# Patient Record
Sex: Female | Born: 1955 | Race: White | Hispanic: No | Marital: Married | Smoking: Never smoker
Health system: Southern US, Community
[De-identification: ages and names within clinical notes are randomized; demographics above are authoritative.]

## PROBLEM LIST (undated history)

## (undated) DIAGNOSIS — G510 Bell's palsy: Secondary | ICD-10-CM

## (undated) DIAGNOSIS — Z87898 Personal history of other specified conditions: Secondary | ICD-10-CM

## (undated) DIAGNOSIS — Z9889 Other specified postprocedural states: Secondary | ICD-10-CM

## (undated) DIAGNOSIS — Z8679 Personal history of other diseases of the circulatory system: Secondary | ICD-10-CM

## (undated) DIAGNOSIS — N210 Calculus in bladder: Secondary | ICD-10-CM

## (undated) DIAGNOSIS — M1990 Unspecified osteoarthritis, unspecified site: Secondary | ICD-10-CM

## (undated) DIAGNOSIS — E039 Hypothyroidism, unspecified: Secondary | ICD-10-CM

## (undated) DIAGNOSIS — M810 Age-related osteoporosis without current pathological fracture: Secondary | ICD-10-CM

## (undated) DIAGNOSIS — I10 Essential (primary) hypertension: Secondary | ICD-10-CM

## (undated) DIAGNOSIS — Z86010 Personal history of colonic polyps: Secondary | ICD-10-CM

## (undated) HISTORY — PX: CARDIAC ELECTROPHYSIOLOGY STUDY AND ABLATION: SHX1294

## (undated) HISTORY — DX: Age-related osteoporosis without current pathological fracture: M81.0

## (undated) HISTORY — DX: Personal history of colonic polyps: Z86.010

## (undated) HISTORY — DX: Essential (primary) hypertension: I10

## (undated) HISTORY — DX: Bell's palsy: G51.0

---

## 1997-07-06 HISTORY — PX: LAPAROSCOPIC ASSISTED VAGINAL HYSTERECTOMY: SHX5398

## 1998-10-01 ENCOUNTER — Ambulatory Visit (HOSPITAL_COMMUNITY): Admission: RE | Admit: 1998-10-01 | Discharge: 1998-10-01 | Payer: Self-pay | Admitting: Internal Medicine

## 1998-10-09 ENCOUNTER — Observation Stay (HOSPITAL_COMMUNITY): Admission: AD | Admit: 1998-10-09 | Discharge: 1998-10-10 | Payer: Self-pay | Admitting: Internal Medicine

## 1999-01-31 ENCOUNTER — Encounter (INDEPENDENT_AMBULATORY_CARE_PROVIDER_SITE_OTHER): Payer: Self-pay

## 1999-01-31 ENCOUNTER — Other Ambulatory Visit: Admission: RE | Admit: 1999-01-31 | Discharge: 1999-01-31 | Payer: Self-pay | Admitting: Obstetrics and Gynecology

## 1999-03-25 ENCOUNTER — Inpatient Hospital Stay (HOSPITAL_COMMUNITY): Admission: RE | Admit: 1999-03-25 | Discharge: 1999-03-28 | Payer: Self-pay | Admitting: Obstetrics and Gynecology

## 1999-03-25 ENCOUNTER — Encounter (INDEPENDENT_AMBULATORY_CARE_PROVIDER_SITE_OTHER): Payer: Self-pay

## 1999-10-21 ENCOUNTER — Encounter: Admission: RE | Admit: 1999-10-21 | Discharge: 1999-10-21 | Payer: Self-pay | Admitting: Obstetrics and Gynecology

## 1999-10-21 ENCOUNTER — Encounter: Payer: Self-pay | Admitting: Obstetrics and Gynecology

## 2000-07-06 HISTORY — PX: CARDIAC CATHETERIZATION: SHX172

## 2001-12-08 ENCOUNTER — Encounter: Admission: RE | Admit: 2001-12-08 | Discharge: 2001-12-08 | Payer: Self-pay | Admitting: Obstetrics and Gynecology

## 2001-12-08 ENCOUNTER — Encounter: Payer: Self-pay | Admitting: Obstetrics and Gynecology

## 2003-02-12 ENCOUNTER — Encounter: Admission: RE | Admit: 2003-02-12 | Discharge: 2003-05-13 | Payer: Self-pay | Admitting: Family Medicine

## 2005-03-12 ENCOUNTER — Encounter: Admission: RE | Admit: 2005-03-12 | Discharge: 2005-03-12 | Payer: Self-pay | Admitting: Internal Medicine

## 2005-08-19 ENCOUNTER — Ambulatory Visit (HOSPITAL_COMMUNITY): Admission: RE | Admit: 2005-08-19 | Discharge: 2005-08-19 | Payer: Self-pay | Admitting: Obstetrics and Gynecology

## 2007-02-15 ENCOUNTER — Ambulatory Visit (HOSPITAL_COMMUNITY): Admission: RE | Admit: 2007-02-15 | Discharge: 2007-02-15 | Payer: Self-pay | Admitting: Obstetrics and Gynecology

## 2008-01-02 ENCOUNTER — Ambulatory Visit (HOSPITAL_COMMUNITY): Admission: RE | Admit: 2008-01-02 | Discharge: 2008-01-02 | Payer: Self-pay | Admitting: Gastroenterology

## 2008-01-17 ENCOUNTER — Ambulatory Visit: Payer: Self-pay | Admitting: Internal Medicine

## 2008-01-20 ENCOUNTER — Ambulatory Visit: Payer: Self-pay | Admitting: Internal Medicine

## 2008-01-20 ENCOUNTER — Ambulatory Visit: Payer: Self-pay

## 2008-04-10 ENCOUNTER — Ambulatory Visit (HOSPITAL_COMMUNITY): Admission: RE | Admit: 2008-04-10 | Discharge: 2008-04-10 | Payer: Self-pay | Admitting: Obstetrics and Gynecology

## 2010-02-03 LAB — HM PAP SMEAR: HM Pap smear: NEGATIVE

## 2010-02-27 ENCOUNTER — Ambulatory Visit (HOSPITAL_COMMUNITY): Admission: RE | Admit: 2010-02-27 | Discharge: 2010-02-27 | Payer: Self-pay | Admitting: Internal Medicine

## 2010-03-26 ENCOUNTER — Ambulatory Visit (HOSPITAL_COMMUNITY): Admission: RE | Admit: 2010-03-26 | Discharge: 2010-03-26 | Payer: Self-pay | Admitting: Obstetrics and Gynecology

## 2010-11-18 NOTE — Letter (Signed)
January 17, 2008    Barry Dienes. Eloise Harman, M.D.  [address]  [place], [place] [address]   RE:  Natalie, Schultz  MRN:  [id number]  /  DOB:  1955/08/02   Dear Reuel Boom:   It was a pleasure seeing Natalie Schultz today at your request  regarding her syncope.   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   What was interesting about the aforementioned spells was that they were  unassociated with the prodrome, unassociated with palpitations for the  most part, and found it somewhat neurally mediated.   These episodes were quite similar.  The first one occurred a month or so  ago.  She was seen on the couch and she lost consciousness.  There was  significant recovery fatigue and as noted above this was quite constant  with her previous episodes.  She has also had a number of interval  episodes of presyncope, all in the last number of months, all quite  similar to the syncopal episode unlike that unassociated with  palpitations.  We need to recall that palpitations were not associated  with her previous episodes 10 years ago either.  Of concern, one of  these episode occurred while working on the elliptical (see below).   She has noted some problems of late with exercise tolerance, manifested  by primarily more fatigue, but also some shortness of breath at work and  she also describes some left arm tingling primarily following exertion.   Her cardiac risk factors are negative for hypertension.   FAMILY HISTORY:  Cigarettes.  She also had a negative Myoview scan for  atypical chest symptoms about 8 years ago.   She could easily have occasional  palpitations that are not tachy  palpitations.  Following her ablation for similar complaints, we  undertook an event monitor, which demonstrated PVCs.   Her current medications include Synthroid 125 mcg and Prilosec, as well  as a variety of vitamins.   Her allergies include ERYTHROMYCIN, BIAXIN, AMOXICILLIN.   SOCIAL HISTORY:  She is married.  She has four children.  She and her  husband used to work for [employer].  She does not use cigarettes or  recreational drugs.  She does drink alcohol occasionally.   Her review of systems was notable for fatigue, hiatal hernia, GE reflux  disease, with some associated chest discomfort sometimes with recumbent,  but not necessarily, and thyroid disease.   PAST SURGICAL HISTORY:  Notable for partial hysterectomy and catheter  ablation.   On examination, she is a middle-aged Caucasian female appearing of her  stated age of 56.  Her blood pressure was 122/85 with a pulse of 61  without orthostatic change.  She did actually have some diastolic  hypertension with blood pressures recorded 91-100 with a maximum pulse  change from 61-83.  Her weight was 158, which is down about 13 pounds  from 7 years ago.  Her HEENT exam demonstrated no icterus, no xanthoma.  The neck veins were flat.  The carotids were brisk and full bilaterally  without bruits.  The back was without kyphosis or scoliosis.  The  lungs  were clear.  Heart sounds regular without murmurs or gallops.  The  abdomen was soft with active bowel sounds.  Femoral pulses were 2+,  distal pulses were intact.  There was no clubbing, cyanosis, or edema.  Neurological exam was grossly normal.  The skin was warm and dry.   Electrocardiogram dated today demonstrated sinus rhythm at 65 with  intervals of 0.15/0.19/0.40.  The axis was leftward to -15.   IMPRESSION:  1. Syncope.  In the past and again presumably thought to be neurally      mediated, question trigger.  2. Previously identified  atrioventricular nodal reentry unassociated      with palpitations, but provoking a syncope status post catheter      ablation without recurrences.  3. Premature ventricular contractions.  4. Chest pain syndrome characterized by largely nonexertional chest      tightness and accompanied contemporaneously with fatigue and left      arm tingling.   Natalie Schultz's symptoms are concerning especially given the fact that  last time she had no palpitations really to guide [place] towards finding the  tachycardia that seemed to have been the culprit.  I suspect that she  has a trigger again for her syncope.  The question is what is going to  turn out to be.  With her chest pain and left arm tingling, I think  excluding ischemia it is important as a Bezold-Jarisch reflex can  certainly induce a neurally-mediated reflex.  I think the likelihood of  this is relatively low.  An arrhythmic trigger is certainly the most  likely suspect given its presentation before and I have arranged for her  to get an event recorded to help [place] try to elucidate this and we will  plan to sit down in about 3-4 weeks' time to review that result with  her.   Thank you very much for the consultation.    Sincerely,      Duke Salvia, MD, [facility]  Electronically Signed    SCK/MedQ  DD: 01/17/2008  DT: 01/18/2008  Job #: [id number]

## 2011-04-07 ENCOUNTER — Other Ambulatory Visit: Payer: Self-pay | Admitting: Certified Nurse Midwife

## 2011-04-07 DIAGNOSIS — Z1231 Encounter for screening mammogram for malignant neoplasm of breast: Secondary | ICD-10-CM

## 2011-04-21 ENCOUNTER — Ambulatory Visit (HOSPITAL_COMMUNITY)
Admission: RE | Admit: 2011-04-21 | Discharge: 2011-04-21 | Disposition: A | Discharge status: Home / Self Care | Payer: BC Managed Care – PPO | Source: Ambulatory Visit | Attending: Certified Nurse Midwife | Admitting: Certified Nurse Midwife

## 2011-04-21 DIAGNOSIS — Z1231 Encounter for screening mammogram for malignant neoplasm of breast: Secondary | ICD-10-CM

## 2012-03-30 ENCOUNTER — Other Ambulatory Visit: Payer: Self-pay | Admitting: Certified Nurse Midwife

## 2012-03-30 DIAGNOSIS — Z1231 Encounter for screening mammogram for malignant neoplasm of breast: Secondary | ICD-10-CM

## 2012-04-26 ENCOUNTER — Ambulatory Visit (HOSPITAL_COMMUNITY)
Admission: RE | Admit: 2012-04-26 | Discharge: 2012-04-26 | Disposition: A | Discharge status: Home / Self Care | Payer: BC Managed Care – PPO | Source: Ambulatory Visit | Attending: Certified Nurse Midwife | Admitting: Certified Nurse Midwife

## 2012-04-26 DIAGNOSIS — Z1231 Encounter for screening mammogram for malignant neoplasm of breast: Secondary | ICD-10-CM | POA: Insufficient documentation

## 2012-04-28 ENCOUNTER — Ambulatory Visit (HOSPITAL_COMMUNITY): Payer: BC Managed Care – PPO

## 2012-10-19 ENCOUNTER — Telehealth: Payer: Self-pay | Admitting: Nurse Practitioner

## 2012-10-19 NOTE — Telephone Encounter (Signed)
[place] patches not staying on/wants to go back to [place] if possible[facility]

## 2012-10-20 NOTE — Telephone Encounter (Signed)
Is she drying area well before applying. If so agreeable to return [place],  Need chart

## 2012-10-25 ENCOUNTER — Ambulatory Visit (INDEPENDENT_AMBULATORY_CARE_PROVIDER_SITE_OTHER): Payer: BC Managed Care – PPO | Admitting: Obstetrics & Gynecology

## 2012-10-25 ENCOUNTER — Encounter: Payer: Self-pay | Admitting: *Deleted

## 2012-10-25 VITALS — BP 118/80 | HR 64 | Temp 98.0°F | Resp 20

## 2012-10-25 DIAGNOSIS — R300 Dysuria: Secondary | ICD-10-CM

## 2012-10-25 DIAGNOSIS — R109 Unspecified abdominal pain: Secondary | ICD-10-CM

## 2012-10-25 LAB — POCT URINALYSIS DIPSTICK
Glucose, UA: NEGATIVE
Nitrite, UA: NEGATIVE
Protein, UA: NEGATIVE
Urobilinogen, UA: NEGATIVE

## 2012-10-25 LAB — CBC
Hemoglobin: 15.1 g/dL — ABNORMAL HIGH (ref 12.0–15.0)
MCHC: 34.2 g/dL (ref 30.0–36.0)
MCV: 86.5 fL (ref 78.0–100.0)
RBC: 5.1 MIL/uL (ref 3.87–5.11)

## 2012-10-25 LAB — COMPREHENSIVE METABOLIC PANEL
ALT: 19 U/L (ref 0–35)
Albumin: 4.4 g/dL (ref 3.5–5.2)
Calcium: 9.7 mg/dL (ref 8.4–10.5)
Creat: 0.83 mg/dL (ref 0.50–1.10)
Glucose, Bld: 87 mg/dL (ref 70–99)
Potassium: 4.4 mEq/L (ref 3.5–5.3)
Total Bilirubin: 0.6 mg/dL (ref 0.3–1.2)
Total Protein: 7.1 g/dL (ref 6.0–8.3)

## 2012-10-25 NOTE — Progress Notes (Signed)
57 y.o. Married Caucasian female [phone] here for complaint of right sided pelvic pain/bloating/discomfort.  Has noticed this for last three to four weeks.  Going on vacation to [place] next month for 2 1/2 weeks.  Associated symptoms for her include lower back pain which can be on the right or left.  Has had a low grade fever for three or four days about 10 days ago.  No N/V/D.  Occasional constipation.  Nausea only today.  Noted four of five pounds weight gain.  Clothes feel tighter.  H/O TAH/RSO 1999 due to fibroids.  Does have her left ovary.  From urinary standpoint, she also reports some urgency and mild dysuria.  Nothing seems to aggrivate the pain but nothing seems to help it either.  She has several individuals in ehr family with gall bladder issues and kidney stones.  Has not really tried any treatments or OTC products. Denies vaginal bleeding or discharge.  ROS:  All other ROS questions are negative except as per HPI.  Exam:   BP 118/80  Pulse 64  Temp(Src) 98 F (36.7 C) (Oral)  Resp 20 General appearance: alert, cooperative and appears stated age Abdomen:  soft, mild epigastric tenderness, no masses/organomegaly/hernias Lymph:  Inguinal adenopathy: neg   Pelvic: External genitalia:  no lesions              Urethra: normal appearing urethra with no masses, tenderness or lesions              Bartholins and Skenes: Bartholin's, Urethra, Skene's normal                 Vagina: normal appearing vagina with normal color and discharge, no lesions, grade 2 cystocele and mild vault weakness              Cervix: absent Bimanual Exam:  Uterus:  absent                               Adnexa:    no masses                               Rectovaginal: Confirms                               Anus:  normal sphincter tone, no lesions  A: Abdominal pain     Urinary urgency     Bloating  P: Really feel CT is best test to evaluate gallbladder, kidneys, ovary.  Patient reluctant due to large  deductible. CBC, CMP Dip U/A in office neg      An After Visit Summary was printed and given to the patient.

## 2012-10-25 NOTE — Telephone Encounter (Signed)
Spoke with pt who has been having pelvic discomfort off an on for 3 weeks on right side. She says it feels bloated and firm at times. Sched OV with SM for 3 pm today.

## 2012-10-25 NOTE — Telephone Encounter (Signed)
Patient requesting appointment with doctor for pelvic pain X three weeks/yeast inf?/please advise/[place]

## 2012-10-26 ENCOUNTER — Ambulatory Visit: Payer: BC Managed Care – PPO | Admitting: Obstetrics and Gynecology

## 2012-10-26 ENCOUNTER — Encounter: Payer: Self-pay | Admitting: Obstetrics & Gynecology

## 2012-10-26 NOTE — Patient Instructions (Signed)
We will call with lab results and discuss CT scan again at that time

## 2012-11-08 ENCOUNTER — Telehealth: Payer: Self-pay | Admitting: Obstetrics & Gynecology

## 2012-11-08 NOTE — Telephone Encounter (Signed)
Patient returning Dr. Miller's call.

## 2012-11-08 NOTE — Telephone Encounter (Signed)
Left msg again for pt

## 2012-11-08 NOTE — Telephone Encounter (Signed)
Left message for pt Natalie Schultz.  Asking for update on pain.  Recommended CT at last visit.  Pt declined.  She is going to [place] and I want to make sure she doesn't need any additional testing before leaving for trip.

## 2012-11-10 NOTE — Telephone Encounter (Signed)
Left msg again for pt

## 2012-11-15 NOTE — Telephone Encounter (Signed)
Dr Hyacinth Meeker, do I need to try her again. She has not called back.

## 2012-11-15 NOTE — Telephone Encounter (Signed)
Please try one more time.  She is probably in [place].

## 2012-11-17 NOTE — Telephone Encounter (Signed)
LMTCB for pt to ask for Natalie Schultz or Natalie Schultz.  aa

## 2012-11-17 NOTE — Telephone Encounter (Signed)
Left detailed message for patient to give update if possible. We know she is going out of town or may already be gone and hope she is feeling better.

## 2012-11-17 NOTE — Telephone Encounter (Signed)
Patients previous appointment was on 10/26/12 with Dr.Silva. She called in regards to lab results from that appointment.

## 2012-12-02 NOTE — Telephone Encounter (Signed)
Spoke with patient, states is still out of town-occasionally will have a twinge in her stomach, but it is not bothering her like it was. Patient has AEX schedule in 9/14, advised to call prn.

## 2013-01-10 ENCOUNTER — Telehealth: Payer: Self-pay | Admitting: Obstetrics & Gynecology

## 2013-01-10 NOTE — Telephone Encounter (Signed)
OV I think would be best.

## 2013-01-10 NOTE — Telephone Encounter (Signed)
Patient notified and OV scheduled for 01-12-13 with Dr Hyacinth Meeker.

## 2013-01-10 NOTE — Telephone Encounter (Signed)
Patient would like to follow up with Dr. Hyacinth Meeker, patient is not sure if she needs an appointment.

## 2013-01-10 NOTE — Telephone Encounter (Signed)
Patient was seen 10-25-12 and complained of bloating and nausea but declined CT evaluation due to cost and travel schedule.  Spoke to [place] in May and felt like symptoms were not as bad.  Patient now reports that she feels symptoms are worse again and have increased over the last week or so.  Continues to feel bloated and have nausea but no vomiting.  Denies fever.  States bowels relatively normal although admits she had to take a Ducolax and had good results.  Complains of "sharp twinges" that will catch her breath.  Rates as high as 5-6/10.  Feels something is just not right and is willing to have CT done if can make payment arrangements.  Given phone number for [facility] to check on payment plan.  Offered at hospital although would be more expensive in the end so patient declines.  Does she need OV since last seen 10-25-12 or schedule CT? Chart to your office.

## 2013-01-12 ENCOUNTER — Ambulatory Visit (INDEPENDENT_AMBULATORY_CARE_PROVIDER_SITE_OTHER): Payer: BC Managed Care – PPO | Admitting: Obstetrics & Gynecology

## 2013-01-12 ENCOUNTER — Telehealth: Payer: Self-pay | Admitting: *Deleted

## 2013-01-12 ENCOUNTER — Encounter: Payer: Self-pay | Admitting: Obstetrics & Gynecology

## 2013-01-12 VITALS — BP 118/78 | HR 52 | Resp 16 | Ht 68.5 in | Wt 164.6 lb

## 2013-01-12 DIAGNOSIS — R1031 Right lower quadrant pain: Secondary | ICD-10-CM

## 2013-01-12 NOTE — Telephone Encounter (Signed)
Scheduled CT of Abdomen and Pelvis at [facility] for July 14th @ 1:30pm. At [address] ,[address]. Patient aware will need to go to location and pick up contrast.  Natalie Schultz will do pre-cert also.

## 2013-01-12 NOTE — Progress Notes (Signed)
Subjective:     Patient ID: Natalie Schultz, female   DOB: 1956/06/21, 57 y.o.   MRN: [id number]  HPI 17 year [id number] MWF here for about three month hx of RLQ and mid abdominal pain.  This can be dull and achy but accompanied by sharp shooting pain.  Does have associated nausea.  Did have some constipation last week and she took a dulolax.  This helped with the constipation but no difference with pain.  Increased urgency one night last week but had a lot of watermelon that day.  No fevers.    Did have sigmoidoscopy two years ago with Dr. Jarold Motto at [facility].  This was negative per patient.  Will try to get results.    Review of Systems  Constitutional: Negative for fever, chills and appetite change.  Respiratory: Negative for cough and shortness of breath.   Cardiovascular: Negative for chest pain.  Gastrointestinal: Positive for nausea, abdominal pain, constipation and abdominal distention. Negative for vomiting, diarrhea and blood in stool.  Endocrine: Negative for cold intolerance and heat intolerance.  Genitourinary: Positive for frequency. Negative for dysuria, urgency, flank pain, vaginal bleeding, vaginal discharge, difficulty urinating, pelvic pain and dyspareunia.  Musculoskeletal: Positive for back pain.  Skin: Negative for color change.  Hematological: Negative for adenopathy.       Objective:   Physical Exam  Constitutional: She is oriented to person, place, and time. She appears well-developed and well-nourished.  HENT:  Head: Normocephalic and atraumatic.  Neck: Normal range of motion. Neck supple.  Abdominal: Soft. Bowel sounds are normal. She exhibits no distension and no mass. There is tenderness (mid rlq tenderness to palpation.  worse with deep palpation.  no rebound.). There is no rebound and no guarding. Hernia confirmed negative in the right inguinal area and confirmed negative in the left inguinal area.  Genitourinary: Vagina normal. There is no rash or  tenderness on the right labia. There is no rash or tenderness on the left labia. Deviated: uterus absent. Cervical motion tenderness: cervix absent. Right adnexum displays no mass and no tenderness. Left adnexum displays no mass and no tenderness. No vaginal discharge found.  Lymphadenopathy:       Right: No inguinal adenopathy present.       Left: No inguinal adenopathy present.  Neurological: She is alert and oriented to person, place, and time.  Skin: Skin is warm and dry.  Psychiatric: She has a normal mood and affect.   Dip u/a in office trace RBC    Assessment:     Mid RLQ pain, normal pelvic exam, nausea     Plan:     Plan CT abdomen/pelvis with PO/IV contrast

## 2013-01-12 NOTE — Patient Instructions (Signed)
We will call you with results  

## 2013-01-16 ENCOUNTER — Ambulatory Visit
Admission: RE | Admit: 2013-01-16 | Discharge: 2013-01-16 | Disposition: A | Discharge status: Home / Self Care | Payer: BC Managed Care – PPO | Source: Ambulatory Visit | Attending: Obstetrics & Gynecology | Admitting: Obstetrics & Gynecology

## 2013-01-16 DIAGNOSIS — R1031 Right lower quadrant pain: Secondary | ICD-10-CM

## 2013-01-16 MED ORDER — IOHEXOL 300 MG/ML  SOLN
100.0000 mL | Freq: Once | INTRAMUSCULAR | Status: AC | PRN
  Administered 2013-01-16: 100 mL via INTRAVENOUS

## 2013-01-17 NOTE — Telephone Encounter (Signed)
Results of ct scan

## 2013-01-17 NOTE — Telephone Encounter (Signed)
Patient notified of CT results as requested by Dr. Hyacinth Meeker . Patient understands the need for appointment with [facility] to follow up on CT results . Will call [facility] in morning and schedule for patient.  Patient aware of need to call Dr. Jarold Motto office for results of sigmoidoscopy to be faxed to our office.

## 2013-01-18 ENCOUNTER — Telehealth: Payer: Self-pay | Admitting: *Deleted

## 2013-01-18 NOTE — Telephone Encounter (Signed)
PATIENT NOTIFIED OF CT RESULTS AS NOTED PER DR. MILLER PATIENT SCHEDULED APPOINTMENT WITH [facility] WITH DR. Lorin Picket MACDIARMIND FOR July 29TH @ 1:00PM. PATIENT AWARE OF THIS APPT./ Patient states will also call Dr,. Patterson office and have  Results of sigmoidoscopy faxed to our office. Records faxed to [facility] sue

## 2013-01-18 NOTE — Telephone Encounter (Signed)
Appointment scheduled with [facility] for 01/31/2013 @ 1:00pm Dr. Alfredo Martinez. Patient aware of this and will also call Dr. Jarold Motto office to send results of sigmoidoscopy to our office.

## 2013-01-18 NOTE — Telephone Encounter (Signed)
Please be sure pt gets appt at Urology.

## 2013-01-31 ENCOUNTER — Other Ambulatory Visit: Payer: Self-pay | Admitting: Urology

## 2013-01-31 ENCOUNTER — Telehealth: Payer: Self-pay | Admitting: Obstetrics & Gynecology

## 2013-01-31 NOTE — Telephone Encounter (Signed)
Patient would like for Dr. Hyacinth Meeker know of her appointment with Dr. Sherron Monday today, 01/31/2013 Will be having out patient surgery on 02/14/2013 with Dr. Gildardo Griffes Diarmid for kidney stone calcification in the bladder wall. States that Dr. Sherron Monday does not think all of her discomfort is coming from bladder problems Patient question today is does she still need to continue taking Miralax. Has been of x 2 weeks now and it is helping along with her diet and protein shake with flax seed in it. Eating spinach salad, bananas and other fruits.. Please advise.

## 2013-01-31 NOTE — Telephone Encounter (Signed)
Patient is calling to update Dr. Hyacinth Meeker about her current state and her visit with the urologist. She would like to ask her if she should "continue doing what she's doing". Patient was vague and didn't want to leave too many details. Please call patient.

## 2013-02-01 NOTE — Telephone Encounter (Signed)
Is okay for her to continue Miralax.  She can also transition to Colace 100mg  BID.  Either is fine long term.  Another option is to just decrease the miralax to two or three times weekly.

## 2013-02-01 NOTE — Telephone Encounter (Signed)
Patient notified of Dr. Hyacinth Meeker response of Miralax and colace. Patient understands instructions and will call back if any problems.

## 2013-02-07 ENCOUNTER — Encounter (HOSPITAL_BASED_OUTPATIENT_CLINIC_OR_DEPARTMENT_OTHER): Payer: Self-pay | Admitting: *Deleted

## 2013-02-07 NOTE — Progress Notes (Signed)
NPO AFTER MN. ARRIVES AT 0745. NEEDS HG AND EKG. WILL TAKE SYNTHROID AM OF SURG W/ SIP OF WATER.

## 2013-02-13 NOTE — H&P (Signed)
History of Present Illness   I was consulted by Dr. Miller regarding Natalie Schultz's abnormal CT scan, which demonstrated calcification in the bladder.   She is a 57-year-old woman who does not wear pads but sometimes leaks with coughing, sneezing, and with urgency. She generally voids every hour but drinks a lot of fluids. She can sit through a 2-hour movie. She gets up sometimes once at night.  She occasionally gets a urinary tract infection. She denies a history of kidney stones and previous GU surgery.  She has never smoked. She never had bladder trauma. She has no neurologic risk factors or symptoms. She is prone to constipation and recently was started on MiraLax.   She has had nonspecific right and left lower quadrant suprapubic bloating and discomfort. She has had some right upper quadrant discomfort. The discomfort has been for months and is fairly steady throughout the day. It is not relieved by voiding.   She had a CT scan in July of 2014 and she had a nondependent area of calcification in the bladder wall. Apparently it was intramural as well as near the bladder lining. There was a calcification whether or not it could be a calcified polyp or dystrophic calcification from trauma. She does not have a trauma history. Her serum creatinine is 0.8.   There is no other modifying factors or associated signs or symptoms. There is no other aggravating or relieving factors. The symptoms are mild to moderate in severity and persisting.    Past Medical History Problems  1. History of  Arthritis V13.4 2. History of  Hypothyroidism 244.9  Surgical History Problems  1. History of  Catheter Ablation 2. History of  Hysterectomy V45.77  Current Meds 1. Aspirin 81 MG Oral Tablet; Therapy: (Recorded:29Jul2014) to 2. Calcium TABS; Therapy: (Recorded:29Jul2014) to 3. Fish Oil CAPS; Therapy: (Recorded:29Jul2014) to 4. Glucosamine Chondr Complex CAPS; Therapy: (Recorded:29Jul2014) to 5.  Multi-Vitamin Oral Tablet; Therapy: (Recorded:29Jul2014) to 6. Nasonex SUSP; Therapy: (Recorded:29Jul2014) to 7. Synthroid 175 MCG Oral Tablet; Therapy: (Recorded:29Jul2014) to 8. Vivelle-Dot 0.05 MG/24HR Transdermal Patch Biweekly; Therapy: (Recorded:29Jul2014) to  Allergies Medication  1. Amoxicillin TABS 2. Biaxin TABS 3. Erythromycin Base TBEC  Family History Problems  1. Paternal history of  Blood In Urine 2. Family history of  Family Health Status Number Of Children four sons 3. Paternal history of  Nephrolithiasis 4. 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  Genitourinary: incontinence.  Gastrointestinal: nausea and constipation.  ENT: sinus problems.  Musculoskeletal: back pain and joint pain.    Vitals Vital Signs [Data Includes: Last 1 Day]  29Jul2014 01:13PM  BMI Calculated: 23.7 BSA Calculated: 1.88 Height: 5 ft 9 in Weight: 160 lb  Blood Pressure: 126 / 77, Sitting Temperature: 98.2 F, Oral Heart Rate: 67 Respiration: 18  Physical Exam Constitutional: Well nourished and well developed . No acute distress.  ENT:. The ears and nose are normal in appearance.  Neck: The appearance of the neck is normal and no neck mass is present.  Pulmonary: No respiratory distress and normal respiratory rhythm and effort.  Cardiovascular: Heart rate and rhythm are normal . No peripheral edema.    Lymphatics: The femoral and inguinal nodes are not enlarged or tender.  Skin: Normal skin turgor, no visible rash and no visible skin lesions.  Neuro/Psych:. Mood and affect are appropriate.   .  Genitourinary: On pelvic examination, Natalie Schultz had a grade 2 rectocele with a modest central defect.  She had grade 2 hypermobility and no stress incontinence.  She had urethral descensus at rest.     Results/Data    Today Natalie Schultz underwent a number of tests, which I personally reviewed.  Urinalysis: Negative.  Bladder Scan: Bladder scan residual was 84 mL.   Cystoscopy: Today the patient underwent cystoscopy after discussing pros, cons and risks. The procedure was performed to assess the bladder/urethra and to rule out an intravesical cause of their symptoms. A well lubricated, sterile cystoscope was utilized and gently inserted into the urethra. The bladder mucosa and trigone were normal. There was no stitch, foreign body, or carcinoma. There was clear urine effluxing from both ureters. The procedure was well tolerated.  I identified a small area of calcification approximately at 2 or 3 o'clock just inside the bladder neck on the patient's left side. It was almost as if the calcification was in a small bubble of mucosa. I saw no evidence of erythema or carcinoma surrounding it or in other areas of the bladder.  I reviewed the CT scan and agree she appears to have an intramural component as well as a component within the bladder lumen. Urine [Data Includes: Last 1 Day]   29Jul2014  COLOR YELLOW   APPEARANCE CLEAR   SPECIFIC GRAVITY 1.010   pH 5.5   GLUCOSE NEG mg/dL  BILIRUBIN NEG   KETONE NEG mg/dL  BLOOD TRACE   PROTEIN NEG mg/dL  UROBILINOGEN 0.2 mg/dL  NITRITE NEG   LEUKOCYTE ESTERASE NEG   SQUAMOUS EPITHELIAL/HPF FEW   WBC NONE SEEN WBC/hpf  RBC 0-2 RBC/hpf  BACTERIA NONE SEEN   CRYSTALS NONE SEEN   CASTS NONE SEEN    Assessment Assessed  1. Abdominal Pain 789.00 2. Urge And Stress Incontinence 788.33  Plan   Discussion/Summary   Natalie Schultz had an abnormal CT scan finding. She has moderate frequency, possibly fluid related, and mild nocturia.  She has mixed stress urge incontinence not wearing pads. I thought it was prudent and agreed with the recommendation of performing cystoscopy. She does have vague abdominal discomfort but my index of suspicion is low that she has interstitial cystitis.   I drew Natalie Schultz a picture. I do not believe she has cancer but I think she should have a biopsy to make certain. I do not believe the calcification is related to her abdominal discomfort. She has had laparoscopy, but it is in a very odd area for a suture. It is very close to the bladder neck.   The more I spoke to Natalie Schultz, she realized that in 1999 she did have a bladder suspension at the same as her hysterectomy and this likely represents a suture near the bladder neck and associated stone. A picture was drawn and she understands the concept of bladder thickness and bladder perforations. We spent a lot of time and we both agreed on the following.  I have recommended for her to have a cystoscopy under anesthesia with the first goal of ruling out cancer. Gentle biopsies will be taken. I then hope to scrape or to remove minimally the stone and to look for any suture. She understands that the stone would be left behind   within the bladder but hopefully it will heal. I would lean towards leaving a catheter in for a few days to avoid filling and emptying, so the area may feel better. I may or may or not do this depending upon the finding. We talked about the usual postoperative course with bleeding and infection. I stressed a bladder perforation and its sequelae including x-ray and even a laparotomy versus long Foley catheter drainage if it is extraperitoneal. I stressed reexposure rates with endoscopic procedures. She should not have open surgery currently since I think this over kill and if she ever had cancer this could help spread the cancer. She consented to the procedure. I am going to send a copy of my note to Dr. Suzanne Miller to keep her  updated on her treatment course.  I thought it would be reasonable to also have the Holmium laser available.   After a thorough review of the management options for the patient's condition the patient  elected to proceed with surgical therapy as noted above. We have discussed the potential benefits and risks of the procedure, side effects of the proposed treatment, the likelihood of the patient achieving the goals of the procedure, and any potential problems that might occur during the procedure or recuperation. Informed consent has been obtained. 

## 2013-02-14 ENCOUNTER — Ambulatory Visit (HOSPITAL_BASED_OUTPATIENT_CLINIC_OR_DEPARTMENT_OTHER)
Admission: RE | Admit: 2013-02-14 | Discharge: 2013-02-14 | Disposition: A | Discharge status: Home / Self Care | Payer: BC Managed Care – PPO | Source: Ambulatory Visit | Attending: Urology | Admitting: Urology

## 2013-02-14 ENCOUNTER — Encounter (HOSPITAL_BASED_OUTPATIENT_CLINIC_OR_DEPARTMENT_OTHER): Payer: Self-pay | Admitting: Anesthesiology

## 2013-02-14 ENCOUNTER — Ambulatory Visit (HOSPITAL_BASED_OUTPATIENT_CLINIC_OR_DEPARTMENT_OTHER): Payer: BC Managed Care – PPO | Admitting: Anesthesiology

## 2013-02-14 ENCOUNTER — Encounter (HOSPITAL_BASED_OUTPATIENT_CLINIC_OR_DEPARTMENT_OTHER)
Admission: RE | Disposition: A | Discharge status: Home / Self Care | Payer: Self-pay | Source: Ambulatory Visit | Attending: Urology

## 2013-02-14 ENCOUNTER — Encounter (HOSPITAL_BASED_OUTPATIENT_CLINIC_OR_DEPARTMENT_OTHER): Payer: Self-pay

## 2013-02-14 DIAGNOSIS — Z87440 Personal history of urinary (tract) infections: Secondary | ICD-10-CM | POA: Insufficient documentation

## 2013-02-14 DIAGNOSIS — Z880 Allergy status to penicillin: Secondary | ICD-10-CM | POA: Insufficient documentation

## 2013-02-14 DIAGNOSIS — N210 Calculus in bladder: Secondary | ICD-10-CM | POA: Insufficient documentation

## 2013-02-14 DIAGNOSIS — Z538 Procedure and treatment not carried out for other reasons: Secondary | ICD-10-CM | POA: Insufficient documentation

## 2013-02-14 DIAGNOSIS — Z881 Allergy status to other antibiotic agents status: Secondary | ICD-10-CM | POA: Insufficient documentation

## 2013-02-14 DIAGNOSIS — N3946 Mixed incontinence: Secondary | ICD-10-CM | POA: Insufficient documentation

## 2013-02-14 DIAGNOSIS — M129 Arthropathy, unspecified: Secondary | ICD-10-CM | POA: Insufficient documentation

## 2013-02-14 DIAGNOSIS — E039 Hypothyroidism, unspecified: Secondary | ICD-10-CM | POA: Insufficient documentation

## 2013-02-14 DIAGNOSIS — Z7982 Long term (current) use of aspirin: Secondary | ICD-10-CM | POA: Insufficient documentation

## 2013-02-14 DIAGNOSIS — Z79899 Other long term (current) drug therapy: Secondary | ICD-10-CM | POA: Insufficient documentation

## 2013-02-14 HISTORY — DX: Hypothyroidism, unspecified: E03.9

## 2013-02-14 HISTORY — DX: Personal history of other specified conditions: Z87.898

## 2013-02-14 HISTORY — DX: Calculus in bladder: N21.0

## 2013-02-14 HISTORY — DX: Personal history of other diseases of the circulatory system: Z86.79

## 2013-02-14 HISTORY — DX: Personal history of other diseases of the circulatory system: Z98.890

## 2013-02-14 LAB — POCT HEMOGLOBIN-HEMACUE: Hemoglobin: 15.1 g/dL — ABNORMAL HIGH (ref 12.0–15.0)

## 2013-02-14 SURGERY — CYSTOSCOPY, WITH BIOPSY
Anesthesia: General

## 2013-02-14 MED ORDER — LACTATED RINGERS IV SOLN
INTRAVENOUS | Status: DC
  Filled 2013-02-14: qty 1000

## 2013-02-14 MED ORDER — LACTATED RINGERS IV SOLN
INTRAVENOUS | Status: DC
  Administered 2013-02-14: 08:00:00 via INTRAVENOUS
  Filled 2013-02-14: qty 1000

## 2013-02-14 MED ORDER — CIPROFLOXACIN IN D5W 400 MG/200ML IV SOLN
400.0000 mg | INTRAVENOUS | Status: AC
  Administered 2013-02-14: 400 mg via INTRAVENOUS
  Filled 2013-02-14: qty 200

## 2013-02-14 SURGICAL SUPPLY — 15 items
BAG DRAIN URO-CYSTO SKYTR STRL (DRAIN) IMPLANT
CANISTER SUCT LVC 12 LTR MEDI- (MISCELLANEOUS) IMPLANT
CATH ROBINSON RED A/P 12FR (CATHETERS) IMPLANT
CATH ROBINSON RED A/P 14FR (CATHETERS) IMPLANT
CLOTH BEACON ORANGE TIMEOUT ST (SAFETY) IMPLANT
DRAPE CAMERA CLOSED 9X96 (DRAPES) IMPLANT
ELECT REM PT RETURN 9FT ADLT (ELECTROSURGICAL)
ELECTRODE REM PT RTRN 9FT ADLT (ELECTROSURGICAL) IMPLANT
GLOVE BIO SURGEON STRL SZ7.5 (GLOVE) IMPLANT
GOWN STRL REIN XL XLG (GOWN DISPOSABLE) IMPLANT
NDL SAFETY ECLIPSE 18X1.5 (NEEDLE) IMPLANT
NEEDLE HYPO 18GX1.5 SHARP (NEEDLE)
PACK CYSTOSCOPY (CUSTOM PROCEDURE TRAY) IMPLANT
SYR 20CC LL (SYRINGE) IMPLANT
WATER STERILE IRR 3000ML UROMA (IV SOLUTION) IMPLANT

## 2013-02-14 NOTE — Interval H&P Note (Signed)
History and Physical Interval Note:  02/14/2013 6:59 AM  Natalie Schultz  has presented today for surgery, with the diagnosis of bladder stone  The various methods of treatment have been discussed with the patient and family. After consideration of risks, benefits and other options for treatment, the patient has consented to  Procedure(s): CYSTOSCOPY WITH BIOPSY/STONE REMOVAL (N/A) HOLMIUM LASER APPLICATION (N/A) as a surgical intervention .  The patient's history has been reviewed, patient examined, no change in status, stable for surgery.  I have reviewed the patient's chart and labs.  Questions were answered to the patient's satisfaction.     Maydelin Deming A

## 2013-02-14 NOTE — Anesthesia Preprocedure Evaluation (Deleted)
Anesthesia Evaluation  Patient identified by MRN, date of birth, ID band Patient awake    Reviewed: Allergy & Precautions, H&P , NPO status , Patient's Chart, lab work & pertinent test results  Airway Mallampati: II TM Distance: >3 FB Neck ROM: Full    Dental  (+) Dental Advisory Given and Teeth Intact   Pulmonary neg pulmonary ROS,  breath sounds clear to auscultation        Cardiovascular + dysrhythmias Atrial Fibrillation Rhythm:Regular Rate:Normal  S/P EP ablation    Neuro/Psych negative neurological ROS  negative psych ROS   GI/Hepatic negative GI ROS, Neg liver ROS,   Endo/Other  negative endocrine ROSHypothyroidism   Renal/GU negative Renal ROS     Musculoskeletal negative musculoskeletal ROS (+)   Abdominal   Peds  Hematology negative hematology ROS (+)   Anesthesia Other Findings   Reproductive/Obstetrics negative OB ROS                          Anesthesia Physical Anesthesia Plan  ASA: II  Anesthesia Plan: General   Post-op Pain Management:    Induction: Intravenous  Airway Management Planned: LMA  Additional Equipment:   Intra-op Plan:   Post-operative Plan: Extubation in OR  Informed Consent: I have reviewed the patients History and Physical, chart, labs and discussed the procedure including the risks, benefits and alternatives for the proposed anesthesia with the patient or authorized representative who has indicated his/her understanding and acceptance.   Dental advisory given  Plan Discussed with: CRNA  Anesthesia Plan Comments:         Anesthesia Quick Evaluation

## 2013-02-14 NOTE — Progress Notes (Signed)
Surgery canceled, Dr. Sherron Monday in to see, due to possible UTI, will collect urine specimen and can be discharged home with RX for cipro.

## 2013-02-15 LAB — URINE CULTURE

## 2013-02-17 ENCOUNTER — Other Ambulatory Visit: Payer: Self-pay | Admitting: Urology

## 2013-02-17 NOTE — Progress Notes (Signed)
Need orders please - pt coming for preop 02/20/13- Thank you

## 2013-02-20 ENCOUNTER — Encounter (HOSPITAL_COMMUNITY): Payer: Self-pay

## 2013-02-20 ENCOUNTER — Encounter (HOSPITAL_COMMUNITY)
Admission: RE | Admit: 2013-02-20 | Discharge: 2013-02-20 | Disposition: A | Discharge status: Home / Self Care | Payer: BC Managed Care – PPO | Source: Ambulatory Visit | Attending: Urology | Admitting: Urology

## 2013-02-20 ENCOUNTER — Encounter (HOSPITAL_COMMUNITY): Payer: Self-pay | Admitting: Pharmacy Technician

## 2013-02-20 HISTORY — DX: Unspecified osteoarthritis, unspecified site: M19.90

## 2013-02-20 LAB — CBC
HCT: 44.1 % (ref 36.0–46.0)
MCH: 29.7 pg (ref 26.0–34.0)
MCHC: 34.5 g/dL (ref 30.0–36.0)
RDW: 12.7 % (ref 11.5–15.5)

## 2013-02-20 NOTE — Patient Instructions (Addendum)
18 Natalie Schultz  02/20/2013   Your procedure is scheduled on:  02/21/13 TUESDAY  Report to [facility] at  0900       AM.  Call this number if you have problems the morning of surgery: [phone]       Remember:   Do not eat food  Or drink :After Midnight. TONIGHT   Take these medicines the morning of surgery with A SIP OF WATER:  Levothyroxine   .  Contacts, dentures or partial plates can not be worn to surgery  Leave suitcase in the car. After surgery it may be brought to your room.  For patients admitted to the hospital, checkout time is 11:00 AM day of  discharge.             SPECIAL INSTRUCTIONS- SEE [facility] PREPARING FOR SURGERY INSTRUCTION SHEET-     DO NOT WEAR JEWELRY, LOTIONS, POWDERS, OR PERFUMES.  WOMEN-- DO NOT SHAVE LEGS OR UNDERARMS FOR 12 HOURS BEFORE SHOWERS. MEN MAY SHAVE FACE.  Patients discharged the day of surgery will not be allowed to drive home. IF going home the day of surgery, you must have a driver and someone to stay with you for the first 24 hours  Name and phone number of your driver:    Brett Canales-  Husband     c  [phone]                                                                                                                                                     Jerah Esty  PST [phone]                 FAILURE TO FOLLOW THESE INSTRUCTIONS MAY RESULT IN  CANCELLATION   OF YOUR SURGERY                                                  Patient Signature _____________________________

## 2013-02-20 NOTE — Progress Notes (Signed)
EKG 8/14 EPIC,  CT abdomen/pelvis 7/14 with mention clear lung bases

## 2013-02-20 NOTE — H&P (Signed)
History of Present Illness   I was consulted by Dr. Hyacinth Meeker regarding Natalie Schultz's abnormal CT scan, which demonstrated calcification in the bladder.   She is a 57 year old woman who does not wear pads but sometimes leaks with coughing, sneezing, and with urgency. She generally voids every hour but drinks a lot of fluids. She can sit through a 2-hour movie. She gets up sometimes once at night.  She occasionally gets a urinary tract infection. She denies a history of kidney stones and previous GU surgery.  She has never smoked. She never had bladder trauma. She has no neurologic risk factors or symptoms. She is prone to constipation and recently was started on MiraLax.   She has had nonspecific right and left lower quadrant suprapubic bloating and discomfort. She has had some right upper quadrant discomfort. The discomfort has been for months and is fairly steady throughout the day. It is not relieved by voiding.   She had a CT scan in July of 2014 and she had a nondependent area of calcification in the bladder wall. Apparently it was intramural as well as near the bladder lining. There was a calcification whether or not it could be a calcified polyp or dystrophic calcification from trauma. She does not have a trauma history. Her serum creatinine is 0.8.   There is no other modifying factors or associated signs or symptoms. There is no other aggravating or relieving factors. The symptoms are mild to moderate in severity and persisting.    Past Medical History Problems  1. History of  Arthritis V13.4 2. History of  Hypothyroidism 244.9  Surgical History Problems  1. History of  Catheter Ablation 2. History of  Hysterectomy V45.77  Current Meds 1. Aspirin 81 MG Oral Tablet; Therapy: (Recorded:29Jul2014) to 2. Calcium TABS; Therapy: (Recorded:29Jul2014) to 3. Fish Oil CAPS; Therapy: (Recorded:29Jul2014) to 4. Glucosamine Chondr Complex CAPS; Therapy: (Recorded:29Jul2014) to 5.  Multi-Vitamin Oral Tablet; Therapy: (Recorded:29Jul2014) to 6. Nasonex SUSP; Therapy: (Recorded:29Jul2014) to 7. Synthroid 175 MCG Oral Tablet; Therapy: (Recorded:29Jul2014) to 8. Vivelle-Dot 0.05 MG/24HR Transdermal Patch Biweekly; Therapy: (Recorded:29Jul2014) to  Allergies Medication  1. Amoxicillin TABS 2. Biaxin TABS 3. Erythromycin Base TBEC  Family History Problems  1. Paternal history of  Blood In Urine 2. Family history of  Family Health Status Number Of Children four sons 3. Paternal history of  Nephrolithiasis 4. 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  Genitourinary: incontinence.  Gastrointestinal: nausea and constipation.  ENT: sinus problems.  Musculoskeletal: back pain and joint pain.    Vitals Vital Signs [Data Includes: Last 1 Day]  29Jul2014 01:13PM  BMI Calculated: 23.7 BSA Calculated: 1.88 Height: 5 ft 9 in Weight: 160 lb  Blood Pressure: 126 / 77, Sitting Temperature: 98.2 F, Oral Heart Rate: 67 Respiration: 18  Physical Exam Constitutional: Well nourished and well developed . No acute distress.  ENT:. The ears and nose are normal in appearance.  Neck: The appearance of the neck is normal and no neck mass is present.  Pulmonary: No respiratory distress and normal respiratory rhythm and effort.  Cardiovascular: Heart rate and rhythm are normal . No peripheral edema.  Lymphatics: The femoral and inguinal nodes are not enlarged or tender.  Skin: Normal skin turgor, no visible rash and no visible skin lesions.  Neuro/Psych:. Mood and affect are appropriate.   .  Genitourinary: On pelvic examination, Natalie Schultz had a grade 2 rectocele with a modest central defect.  She had grade 2 hypermobility and no stress incontinence.  She had urethral descensus at rest.     Results/Data    Today Natalie Schultz underwent a number of tests, which I personally reviewed.  Urinalysis: Negative.  Bladder Scan: Bladder scan residual was 84 mL.   Cystoscopy: Today the patient underwent cystoscopy after discussing pros, cons and risks. The procedure was performed to assess the bladder/urethra and to rule out an intravesical cause of their symptoms. A well lubricated, sterile cystoscope was utilized and gently inserted into the urethra. The bladder mucosa and trigone were normal. There was no stitch, foreign body, or carcinoma. There was clear urine effluxing from both ureters. The procedure was well tolerated.  I identified a small area of calcification approximately at 2 or 3 o'clock just inside the bladder neck on the patient's left side. It was almost as if the calcification was in a small bubble of mucosa. I saw no evidence of erythema or carcinoma surrounding it or in other areas of the bladder.  I reviewed the CT scan and agree she appears to have an intramural component as well as a component within the bladder lumen. Urine [Data Includes: Last 1 Day]   29Jul2014  COLOR YELLOW   APPEARANCE CLEAR   SPECIFIC GRAVITY 1.010   pH 5.5   GLUCOSE NEG mg/dL  BILIRUBIN NEG   KETONE NEG mg/dL  BLOOD TRACE   PROTEIN NEG mg/dL  UROBILINOGEN 0.2 mg/dL  NITRITE NEG   LEUKOCYTE ESTERASE NEG   SQUAMOUS EPITHELIAL/HPF FEW   WBC NONE SEEN WBC/hpf  RBC 0-2 RBC/hpf  BACTERIA NONE SEEN   CRYSTALS NONE SEEN   CASTS NONE SEEN    Assessment Assessed  1. Abdominal Pain 789.00 2. Urge And Stress Incontinence 788.33  Plan   Discussion/Summary   Natalie Schultz had an abnormal CT scan finding. She has moderate frequency, possibly fluid related, and mild nocturia.  She has mixed stress urge incontinence not wearing pads. I thought it was prudent and agreed with the recommendation of performing cystoscopy. She does have vague abdominal discomfort but my index of suspicion is low that she has interstitial cystitis.   I drew Natalie Schultz a picture. I do not believe she has cancer but I think she should have a biopsy to make certain. I do not believe the calcification is related to her abdominal discomfort. She has had laparoscopy, but it is in a very odd area for a suture. It is very close to the bladder neck.   The more I spoke to Natalie Schultz, she realized that in 1999 she did have a bladder suspension at the same as her hysterectomy and this likely represents a suture near the bladder neck and associated stone. A picture was drawn and she understands the concept of bladder thickness and bladder perforations. We spent a lot of time and we both agreed on the following.  I have recommended for her to have a cystoscopy under anesthesia with the first goal of ruling out cancer. Gentle biopsies will be taken. I then hope to scrape or to remove minimally the stone and to look for any suture. She understands that the stone would be left behind  within the bladder but hopefully it will heal. I would lean towards leaving a catheter in for a few days to avoid filling and emptying, so the area may feel better. I may or may or not do this depending upon the finding. We talked about the usual postoperative course with bleeding and infection. I stressed a bladder perforation and its sequelae including x-ray and even a laparotomy versus long Foley catheter drainage if it is extraperitoneal. I stressed reexposure rates with endoscopic procedures. She should not have open surgery currently since I think this over kill and if she ever had cancer this could help spread the cancer. She consented to the procedure. I am going to send a copy of my note to Dr. Leda Quail to keep her  updated on her treatment course.  I thought it would be reasonable to also have the Holmium laser available.   After a thorough review of the management options for the patient's condition the patient  elected to proceed with surgical therapy as noted above. We have discussed the potential benefits and risks of the procedure, side effects of the proposed treatment, the likelihood of the patient achieving the goals of the procedure, and any potential problems that might occur during the procedure or recuperation. Informed consent has been obtained.

## 2013-02-21 ENCOUNTER — Encounter (HOSPITAL_COMMUNITY)
Admission: RE | Disposition: A | Discharge status: Home / Self Care | Payer: Self-pay | Source: Ambulatory Visit | Attending: Urology

## 2013-02-21 ENCOUNTER — Ambulatory Visit (HOSPITAL_COMMUNITY)
Admission: RE | Admit: 2013-02-21 | Discharge: 2013-02-21 | Disposition: A | Discharge status: Home / Self Care | Payer: BC Managed Care – PPO | Source: Ambulatory Visit | Attending: Urology | Admitting: Urology

## 2013-02-21 ENCOUNTER — Encounter (HOSPITAL_COMMUNITY): Payer: Self-pay | Admitting: *Deleted

## 2013-02-21 ENCOUNTER — Encounter (HOSPITAL_COMMUNITY): Payer: Self-pay | Admitting: Anesthesiology

## 2013-02-21 ENCOUNTER — Ambulatory Visit (HOSPITAL_COMMUNITY): Payer: BC Managed Care – PPO | Admitting: Anesthesiology

## 2013-02-21 DIAGNOSIS — Z90710 Acquired absence of both cervix and uterus: Secondary | ICD-10-CM | POA: Insufficient documentation

## 2013-02-21 DIAGNOSIS — Z8042 Family history of malignant neoplasm of prostate: Secondary | ICD-10-CM | POA: Insufficient documentation

## 2013-02-21 DIAGNOSIS — Z79899 Other long term (current) drug therapy: Secondary | ICD-10-CM | POA: Insufficient documentation

## 2013-02-21 DIAGNOSIS — Z87440 Personal history of urinary (tract) infections: Secondary | ICD-10-CM | POA: Insufficient documentation

## 2013-02-21 DIAGNOSIS — N3941 Urge incontinence: Secondary | ICD-10-CM | POA: Insufficient documentation

## 2013-02-21 DIAGNOSIS — M129 Arthropathy, unspecified: Secondary | ICD-10-CM | POA: Insufficient documentation

## 2013-02-21 DIAGNOSIS — M795 Residual foreign body in soft tissue: Secondary | ICD-10-CM | POA: Insufficient documentation

## 2013-02-21 DIAGNOSIS — N210 Calculus in bladder: Secondary | ICD-10-CM | POA: Insufficient documentation

## 2013-02-21 DIAGNOSIS — Z7982 Long term (current) use of aspirin: Secondary | ICD-10-CM | POA: Insufficient documentation

## 2013-02-21 DIAGNOSIS — E039 Hypothyroidism, unspecified: Secondary | ICD-10-CM | POA: Insufficient documentation

## 2013-02-21 HISTORY — PX: HOLMIUM LASER APPLICATION: SHX5852

## 2013-02-21 HISTORY — PX: CYSTOSCOPY WITH BIOPSY: SHX5122

## 2013-02-21 SURGERY — CYSTOSCOPY, WITH BIOPSY
Anesthesia: General | Site: Bladder | Wound class: Clean Contaminated

## 2013-02-21 MED ORDER — STERILE WATER FOR IRRIGATION IR SOLN
Status: DC | PRN
  Administered 2013-02-21: 3000 mL via INTRAVESICAL

## 2013-02-21 MED ORDER — PROPOFOL 10 MG/ML IV BOLUS
INTRAVENOUS | Status: DC | PRN
  Administered 2013-02-21: 200 mg via INTRAVENOUS

## 2013-02-21 MED ORDER — FENTANYL CITRATE 0.05 MG/ML IJ SOLN
INTRAMUSCULAR | Status: DC | PRN
  Administered 2013-02-21 (×3): 50 ug via INTRAVENOUS

## 2013-02-21 MED ORDER — ONDANSETRON HCL 4 MG/2ML IJ SOLN
INTRAMUSCULAR | Status: DC | PRN
  Administered 2013-02-21: 4 mg via INTRAVENOUS

## 2013-02-21 MED ORDER — PROMETHAZINE HCL 25 MG/ML IJ SOLN: 6.2500 mg | INTRAMUSCULAR | Status: DC | PRN

## 2013-02-21 MED ORDER — HYDROMORPHONE HCL PF 1 MG/ML IJ SOLN: 0.2500 mg | INTRAMUSCULAR | Status: DC | PRN

## 2013-02-21 MED ORDER — MIDAZOLAM HCL 5 MG/5ML IJ SOLN
INTRAMUSCULAR | Status: DC | PRN
  Administered 2013-02-21: 2 mg via INTRAVENOUS

## 2013-02-21 MED ORDER — CIPROFLOXACIN IN D5W 400 MG/200ML IV SOLN
INTRAVENOUS | Status: AC
  Filled 2013-02-21: qty 200

## 2013-02-21 MED ORDER — EPHEDRINE SULFATE 50 MG/ML IJ SOLN
INTRAMUSCULAR | Status: DC | PRN
  Administered 2013-02-21: 5 mg via INTRAVENOUS

## 2013-02-21 MED ORDER — DEXAMETHASONE SODIUM PHOSPHATE 10 MG/ML IJ SOLN
INTRAMUSCULAR | Status: DC | PRN
  Administered 2013-02-21: 10 mg via INTRAVENOUS

## 2013-02-21 MED ORDER — LACTATED RINGERS IV SOLN: INTRAVENOUS | Status: DC

## 2013-02-21 MED ORDER — LACTATED RINGERS IV SOLN
INTRAVENOUS | Status: DC
  Administered 2013-02-21: 13:00:00 via INTRAVENOUS
  Administered 2013-02-21: 1000 mL via INTRAVENOUS

## 2013-02-21 MED ORDER — OXYCODONE HCL 5 MG/5ML PO SOLN
5.0000 mg | Freq: Once | ORAL | Status: AC | PRN
  Filled 2013-02-21: qty 5

## 2013-02-21 MED ORDER — HYDROCODONE-ACETAMINOPHEN 5-325 MG PO TABS: 1.0000 | ORAL_TABLET | Freq: Four times a day (QID) | ORAL | Status: DC | PRN

## 2013-02-21 MED ORDER — CIPROFLOXACIN IN D5W 400 MG/200ML IV SOLN
400.0000 mg | INTRAVENOUS | Status: AC
  Administered 2013-02-21: 400 mg via INTRAVENOUS

## 2013-02-21 MED ORDER — LIDOCAINE HCL (CARDIAC) 20 MG/ML IV SOLN
INTRAVENOUS | Status: DC | PRN
  Administered 2013-02-21: 100 mg via INTRAVENOUS

## 2013-02-21 MED ORDER — OXYCODONE HCL 5 MG PO TABS
5.0000 mg | ORAL_TABLET | Freq: Once | ORAL | Status: AC | PRN
  Administered 2013-02-21: 5 mg via ORAL
  Filled 2013-02-21: qty 1

## 2013-02-21 SURGICAL SUPPLY — 22 items
BAG URINE DRAINAGE (UROLOGICAL SUPPLIES) ×3 IMPLANT
BAG URO CATCHER STRL LF (DRAPE) ×3 IMPLANT
CATH FOLEY 2WAY SLVR  5CC 14FR (CATHETERS) ×1
CATH FOLEY 2WAY SLVR 5CC 14FR (CATHETERS) ×2 IMPLANT
CATH ROBINSON RED A/P 16FR (CATHETERS) IMPLANT
CATH URET 5FR 28IN OPEN ENDED (CATHETERS) IMPLANT
CLOTH BEACON ORANGE TIMEOUT ST (SAFETY) ×3 IMPLANT
DRAPE CAMERA CLOSED 9X96 (DRAPES) ×3 IMPLANT
ELECT REM PT RETURN 9FT ADLT (ELECTROSURGICAL)
ELECTRODE REM PT RTRN 9FT ADLT (ELECTROSURGICAL) IMPLANT
GLOVE BIOGEL M STRL SZ7.5 (GLOVE) ×3 IMPLANT
GLOVE SURG SS PI 8.0 STRL IVOR (GLOVE) ×3 IMPLANT
GOWN PREVENTION PLUS XLARGE (GOWN DISPOSABLE) ×3 IMPLANT
GOWN STRL REIN XL XLG (GOWN DISPOSABLE) ×3 IMPLANT
HOLDER FOLEY CATH W/STRAP (MISCELLANEOUS) IMPLANT
LASER FIBER DISP (UROLOGICAL SUPPLIES) ×3 IMPLANT
MANIFOLD NEPTUNE II (INSTRUMENTS) ×3 IMPLANT
MARKER SKIN DUAL TIP RULER LAB (MISCELLANEOUS) ×3 IMPLANT
NEEDLE HYPO 25X1 1.5 SAFETY (NEEDLE) IMPLANT
PACK CYSTO (CUSTOM PROCEDURE TRAY) ×3 IMPLANT
TUBING CONNECTING 10 (TUBING) ×3 IMPLANT
WATER STERILE IRR 3000ML UROMA (IV SOLUTION) ×6 IMPLANT

## 2013-02-21 NOTE — Preoperative (Signed)
Beta Blockers   Reason not to administer Beta Blockers:Not Applicable 

## 2013-02-21 NOTE — Anesthesia Preprocedure Evaluation (Signed)
Anesthesia Evaluation  Patient identified by MRN, date of birth, ID band Patient awake    Reviewed: Allergy & Precautions, H&P , NPO status , Patient's Chart, lab work & pertinent test results  Airway Mallampati: II TM Distance: >3 FB Neck ROM: Full    Dental  (+) Dental Advisory Given, Teeth Intact and Caps   Pulmonary neg pulmonary ROS,  breath sounds clear to auscultation  Pulmonary exam normal       Cardiovascular + dysrhythmias (Prior ablation procedure for arrhythmia) Atrial Fibrillation Rhythm:Regular Rate:Normal  S/P EP ablation    Neuro/Psych negative neurological ROS  negative psych ROS   GI/Hepatic negative GI ROS, Neg liver ROS,   Endo/Other  negative endocrine ROSHypothyroidism   Renal/GU negative Renal ROS     Musculoskeletal negative musculoskeletal ROS (+)   Abdominal   Peds  Hematology negative hematology ROS (+)   Anesthesia Other Findings Caps upper incisors  Reproductive/Obstetrics negative OB ROS                           Anesthesia Physical Anesthesia Plan  ASA: II  Anesthesia Plan: General   Post-op Pain Management:    Induction: Intravenous  Airway Management Planned: LMA  Additional Equipment:   Intra-op Plan:   Post-operative Plan: Extubation in OR  Informed Consent: I have reviewed the patients History and Physical, chart, labs and discussed the procedure including the risks, benefits and alternatives for the proposed anesthesia with the patient or authorized representative who has indicated his/her understanding and acceptance.   Dental advisory given  Plan Discussed with: CRNA  Anesthesia Plan Comments:         Anesthesia Quick Evaluation

## 2013-02-21 NOTE — Transfer of Care (Signed)
Immediate Anesthesia Transfer of Care Note  Patient: Natalie Schultz  Procedure(s) Performed: Procedure(s) with comments: CYSTOSCOPY WITH BIOPSY  Transurethral laser of bladder stone and foreign body (N/A) - 60 mins req for this case  REMOVAL OF STONE BLADDER BIOPSY **TUR SET UP**  HOLMIUM LASER APPLICATION (N/A)  Patient Location: PACU  Anesthesia Type:General  Level of Consciousness: awake, oriented, patient cooperative, lethargic and responds to stimulation  Airway & Oxygen Therapy: Patient Spontanous Breathing and Patient connected to face mask oxygen  Post-op Assessment: Report given to PACU RN, Post -op Vital signs reviewed and stable and Patient moving all extremities  Post vital signs: Reviewed and stable  Complications: No apparent anesthesia complications

## 2013-02-21 NOTE — Interval H&P Note (Signed)
History and Physical Interval Note:  02/21/2013 11:46 AM  Natalie Schultz  has presented today for surgery, with the diagnosis of Stone in Bladder  The various methods of treatment have been discussed with the patient and family. After consideration of risks, benefits and other options for treatment, the patient has consented to  Procedure(s) with comments: CYSTOSCOPY WITH BIOPSY (N/A) - 60 mins req for this case  REMOVAL OF STONE BLADDER BIOPSY **TUR SET UP**  HOLMIUM LASER APPLICATION (N/A) as a surgical intervention .  The patient's history has been reviewed, patient examined, no change in status, stable for surgery.  I have reviewed the patient's chart and labs.  Questions were answered to the patient's satisfaction.     Kelvyn Schunk A

## 2013-02-21 NOTE — Op Note (Signed)
Preoperative diagnosis: Bladder Stone and foreign body Postoperative diagnosis: Bladder Stone and foreign body Surgery: Transurethral laser lithotripsy a small bladder stone and removal of suture and cystoscopy Surgeon: Dr. Lorin Picket Cantrell Martus  The patient consented above procedure. To the above diagnoses. Extra care was taken with leg positioning. Preoperative antibiotics were given.  I cystoscoped the patient with a 30 lens. Trigone and bladder mucosa were normal. She had a looped free suture it was calcified but turned out likely to be Prolene in a small stone on its loop identified at 2:00 just inside the bladder neck  I used the holmium laser 500  probe. With a little bit of turning of the scope and lens I was able to get good purchase at 0.8 power x5 and was able to laser the suture where it entered the mucosa in 2 areas. It actually went very well. Initially the calcification around the blue suture was relieved first.  The entire expose sutured stone was removed. One could argue at full bladder volume she had 1 mm of exposure that hopefully will epithelialize over especially with a Foley catheter for a few days afterwards. There is no injury to ureters or bladder or bleeding   Patient was taken to recovery

## 2013-02-22 ENCOUNTER — Encounter (HOSPITAL_COMMUNITY): Payer: Self-pay | Admitting: Urology

## 2013-02-22 NOTE — Anesthesia Postprocedure Evaluation (Signed)
Anesthesia Post Note  Patient: Natalie Schultz  Procedure(s) Performed: Procedure(s) (LRB): CYSTOSCOPY WITH BIOPSY  Transurethral laser of bladder stone and foreign body (N/A) HOLMIUM LASER APPLICATION (N/A)  Anesthesia type: General  Patient location: PACU  Post pain: Pain level controlled  Post assessment: Post-op Vital signs reviewed  Last Vitals:  Filed Vitals:   02/21/13 1516  BP: 122/78  Pulse: 70  Temp: 36.7 C  Resp: 16    Post vital signs: Reviewed  Level of consciousness: sedated  Complications: No apparent anesthesia complications

## 2013-03-08 ENCOUNTER — Encounter: Payer: Self-pay | Admitting: Internal Medicine

## 2013-03-14 ENCOUNTER — Ambulatory Visit: Payer: Self-pay | Admitting: Certified Nurse Midwife

## 2013-03-29 ENCOUNTER — Ambulatory Visit (AMBULATORY_SURGERY_CENTER): Payer: Self-pay | Admitting: *Deleted

## 2013-03-29 ENCOUNTER — Other Ambulatory Visit: Payer: Self-pay | Admitting: Certified Nurse Midwife

## 2013-03-29 ENCOUNTER — Other Ambulatory Visit: Payer: Self-pay | Admitting: Obstetrics & Gynecology

## 2013-03-29 VITALS — Ht 68.5 in | Wt 171.0 lb

## 2013-03-29 DIAGNOSIS — Z1211 Encounter for screening for malignant neoplasm of colon: Secondary | ICD-10-CM

## 2013-03-29 DIAGNOSIS — Z1231 Encounter for screening mammogram for malignant neoplasm of breast: Secondary | ICD-10-CM

## 2013-03-29 MED ORDER — NA SULFATE-K SULFATE-MG SULF 17.5-3.13-1.6 GM/177ML PO SOLN: ORAL | Status: DC

## 2013-03-29 NOTE — Telephone Encounter (Signed)
AEX scheduled for 05/21/13 #8 patch with 1 refill sent to last patient until AEX

## 2013-03-29 NOTE — Progress Notes (Signed)
Patient denies any allergies to eggs or soy. Patient denies any problems with anesthesia.  

## 2013-03-30 ENCOUNTER — Encounter: Payer: Self-pay | Admitting: Internal Medicine

## 2013-04-03 ENCOUNTER — Telehealth: Payer: Self-pay | Admitting: Obstetrics & Gynecology

## 2013-04-03 NOTE — Telephone Encounter (Signed)
Patient thinks she may have a yeast infection but declined appointment due to busy schedule. Patient requesting advise for over-the-counter medicines to use?

## 2013-04-03 NOTE — Telephone Encounter (Signed)
Spoke with pt about non prescription remedies for yeast. Pt has white DC and itching. Was recently on antibiotic after bladder surgery. Advised to try either 3 day or 7 day generic monistat cream or suppositories. Also to avoid concentrated sweets and sodas. Pt states she drinks a lot of water anyway. Advised to eat some yogurt with live active cultures, which pt has already and will try. Advised to wear all cotton underwear and change frequently to keep area dry. Also pt can try Aveeno oatmeal sitz bath to help soothe irritation. Pt will try these and call back if appt needed.

## 2013-04-07 ENCOUNTER — Ambulatory Visit (AMBULATORY_SURGERY_CENTER): Payer: BC Managed Care – PPO | Admitting: Internal Medicine

## 2013-04-07 ENCOUNTER — Encounter: Payer: Self-pay | Admitting: Internal Medicine

## 2013-04-07 VITALS — BP 136/100 | HR 62 | Temp 97.7°F | Resp 19 | Ht 68.5 in | Wt 171.0 lb

## 2013-04-07 DIAGNOSIS — K5730 Diverticulosis of large intestine without perforation or abscess without bleeding: Secondary | ICD-10-CM

## 2013-04-07 DIAGNOSIS — D126 Benign neoplasm of colon, unspecified: Secondary | ICD-10-CM

## 2013-04-07 DIAGNOSIS — Z1211 Encounter for screening for malignant neoplasm of colon: Secondary | ICD-10-CM

## 2013-04-07 HISTORY — PX: COLONOSCOPY W/ POLYPECTOMY: SHX1380

## 2013-04-07 MED ORDER — SODIUM CHLORIDE 0.9 % IV SOLN: 500.0000 mL | INTRAVENOUS | Status: DC

## 2013-04-07 NOTE — Patient Instructions (Addendum)
I found and removed 2 tiny polyps that look benign. You also have diverticulosis. Common and not usually a problem.  I will let you know pathology results and when to have another routine colonoscopy by mail.  I appreciate the opportunity to care for you. Iva Boop, MD, FACG  YOU HAD AN ENDOSCOPIC PROCEDURE TODAY AT THE [facility]: Refer to the procedure report that was given to you for any specific questions about what was found during the examination.  If the procedure report does not answer your questions, please call your gastroenterologist to clarify.  If you requested that your care partner not be given the details of your procedure findings, then the procedure report has been included in a sealed envelope for you to review at your convenience later.  YOU SHOULD EXPECT: Some feelings of bloating in the abdomen. Passage of more gas than usual.  Walking can help get rid of the air that was put into your GI tract during the procedure and reduce the bloating. If you had a lower endoscopy (such as a colonoscopy or flexible sigmoidoscopy) you may notice spotting of blood in your stool or on the toilet paper. If you underwent a bowel prep for your procedure, then you may not have a normal bowel movement for a few days.  DIET: Your first meal following the procedure should be a light meal and then it is ok to progress to your normal diet.  A half-sandwich or bowl of soup is an example of a good first meal.  Heavy or fried foods are harder to digest and may make you feel nauseous or bloated.  Likewise meals heavy in dairy and vegetables can cause extra gas to form and this can also increase the bloating.  Drink plenty of fluids but you should avoid alcoholic beverages for 24 hours.  ACTIVITY: Your care partner should take you home directly after the procedure.  You should plan to take it easy, moving slowly for the rest of the day.  You can resume normal activity the day after the  procedure however you should NOT DRIVE or use heavy machinery for 24 hours (because of the sedation medicines used during the test).    SYMPTOMS TO REPORT IMMEDIATELY: A gastroenterologist can be reached at any hour.  During normal business hours, 8:30 AM to 5:00 PM Monday through Friday, call [phone].  After hours and on weekends, please call the GI answering service at [phone] who will take a message and have the physician on call contact you.   Following lower endoscopy (colonoscopy or flexible sigmoidoscopy):  Excessive amounts of blood in the stool  Significant tenderness or worsening of abdominal pains  Swelling of the abdomen that is new, acute  Fever of 100F or higher  FOLLOW UP: If any biopsies were taken you will be contacted by phone or by letter within the next 1-3 weeks.  Call your gastroenterologist if you have not heard about the biopsies in 3 weeks.  Our staff will call the home number listed on your records the next business day following your procedure to check on you and address any questions or concerns that you may have at that time regarding the information given to you following your procedure. This is a courtesy call and so if there is no answer at the home number and we have not heard from you through the emergency physician on call, we will assume that you have returned to your regular daily activities  without incident.  SIGNATURES/CONFIDENTIALITY: You and/or your care partner have signed paperwork which will be entered into your electronic medical record.  These signatures attest to the fact that that the information above on your After Visit Summary has been reviewed and is understood.  Full responsibility of the confidentiality of this discharge information lies with you and/or your care-partner.

## 2013-04-07 NOTE — Progress Notes (Signed)
Called to room to assist during endoscopic procedure.  Patient ID and intended procedure confirmed with present staff. Received instructions for my participation in the procedure from the performing physician.  

## 2013-04-07 NOTE — Op Note (Signed)
[facility] [address] [place], [address]   COLONOSCOPY PROCEDURE REPORT  PATIENT: Natalie Schultz, Natalie Schultz  MR#: [id number] BIRTHDATE: October 07, 1955 , 57  yrs. old GENDER: Female ENDOSCOPIST: Iva Boop, MD, [facility] REFERRED XB:[id number] Eloise Harman, M.D. PROCEDURE DATE:  04/07/2013 PROCEDURE:   Colonoscopy with biopsy and snare polypectomy First Screening Colonoscopy - Avg.  risk and is 50 yrs.  old or older Yes.  Prior Negative Screening - Now for repeat screening. N/A  History of Adenoma - Now for follow-up colonoscopy & has been > or = to 3 yrs.  N/A  Polyps Removed Today? Yes. ASA CLASS:   Class II INDICATIONS:average risk screening and first colonoscopy. MEDICATIONS: propofol (Diprivan) 250mg  IV, MAC sedation, administered by CRNA, and These medications were titrated to patient response per physician's verbal order  DESCRIPTION OF PROCEDURE:   After the risks benefits and alternatives of the procedure were thoroughly explained, informed consent was obtained.  A digital rectal exam revealed no abnormalities of the rectum.   The LB [id number]  endoscope was introduced through the anus and advanced to the cecum, which was identified by both the appendix and ileocecal valve. No adverse events experienced.   The quality of the prep was excellent using Suprep  The instrument was then slowly withdrawn as the colon was fully examined.      COLON FINDINGS: Two sessile polyps measuring 2 and 6 mm in size were found at the cecum.  A polypectomy was performed with cold forceps and with a cold snare.  The resection was complete and the polyp tissue was completely retrieved.   Moderate diverticulosis was noted in the sigmoid colon.   The colon mucosa was otherwise normal.   A right colon retroflexion was performed.  Retroflexed views revealed no abnormalities. The time to cecum=3 minutes 03 seconds.  Withdrawal time=11 minutes 14 seconds.  The scope was withdrawn and  the procedure completed. COMPLICATIONS: There were no complications.  ENDOSCOPIC IMPRESSION: 1.   Two sessile polyps measuring 2 and 6 mm in size were found at the cecum; polypectomy was performed with cold forceps and with a cold snare 2.   Moderate diverticulosis was noted in the sigmoid colon 3.   The colon mucosa was otherwise normal  - excellent prep - first colonoscopy  RECOMMENDATIONS: Timing of repeat colonoscopy will be determined by pathology findings.  eSigned:  Iva Boop, MD, [facility] 04/07/2013 8:28 AM   cc: Jarome Matin, MD and The Patient

## 2013-04-07 NOTE — Progress Notes (Signed)
Patient did not have preoperative order for IV antibiotic SSI prophylaxis. (G8918)  Patient did not experience any of the following events: a burn prior to discharge; a fall within the facility; wrong site/side/patient/procedure/implant event; or a hospital transfer or hospital admission upon discharge from the facility. (G8907)  

## 2013-04-07 NOTE — Progress Notes (Signed)
Report to pacu rn, vss, bbs=clear 

## 2013-04-10 ENCOUNTER — Telehealth: Payer: Self-pay | Admitting: *Deleted

## 2013-04-10 NOTE — Telephone Encounter (Signed)
  Follow up Call-  Call back number 04/07/2013  Post procedure Call Back phone  # [phone]  Permission to leave phone message Yes     Patient questions:  Do you have a fever, pain , or abdominal swelling? no Pain Score  0 *  Have you tolerated food without any problems? yes  Have you been able to return to your normal activities? yes  Do you have any questions about your discharge instructions: Diet   no Medications  no Follow up visit  no  Do you have questions or concerns about your Care? no  Actions: * If pain score is 4 or above: No action needed, pain <4.

## 2013-04-11 ENCOUNTER — Encounter: Payer: Self-pay | Admitting: Internal Medicine

## 2013-04-11 DIAGNOSIS — Z860100 Personal history of colon polyps, unspecified: Secondary | ICD-10-CM | POA: Insufficient documentation

## 2013-04-11 HISTORY — DX: Personal history of colonic polyps: Z86.010

## 2013-04-11 HISTORY — DX: Personal history of colon polyps, unspecified: Z86.0100

## 2013-04-11 NOTE — Progress Notes (Signed)
Quick Note:  2 adenomas max 6 mm Repeat colonoscopy 2017 ______

## 2013-04-11 NOTE — Progress Notes (Signed)
Quick Note:  Repeat colonoscopy 2019 ______

## 2013-04-27 ENCOUNTER — Ambulatory Visit (HOSPITAL_COMMUNITY)
Admission: RE | Admit: 2013-04-27 | Discharge: 2013-04-27 | Disposition: A | Discharge status: Home / Self Care | Payer: BC Managed Care – PPO | Source: Ambulatory Visit | Attending: Obstetrics & Gynecology | Admitting: Obstetrics & Gynecology

## 2013-04-27 DIAGNOSIS — Z1231 Encounter for screening mammogram for malignant neoplasm of breast: Secondary | ICD-10-CM | POA: Insufficient documentation

## 2013-05-01 ENCOUNTER — Encounter: Payer: Self-pay | Admitting: Obstetrics & Gynecology

## 2013-05-01 ENCOUNTER — Ambulatory Visit (INDEPENDENT_AMBULATORY_CARE_PROVIDER_SITE_OTHER): Payer: BC Managed Care – PPO | Admitting: Obstetrics & Gynecology

## 2013-05-01 VITALS — BP 140/88 | HR 64 | Resp 20 | Ht 68.25 in | Wt 167.0 lb

## 2013-05-01 DIAGNOSIS — Z0000 Encounter for general adult medical examination without abnormal findings: Secondary | ICD-10-CM

## 2013-05-01 DIAGNOSIS — Z01419 Encounter for gynecological examination (general) (routine) without abnormal findings: Secondary | ICD-10-CM

## 2013-05-01 LAB — POCT URINALYSIS DIPSTICK
Bilirubin, UA: NEGATIVE
Blood, UA: NEGATIVE
Glucose, UA: NEGATIVE
Ketones, UA: NEGATIVE
Protein, UA: NEGATIVE

## 2013-05-01 LAB — TSH: TSH: 2.505 u[IU]/mL (ref 0.350–4.500)

## 2013-05-01 MED ORDER — ESTRADIOL 0.0375 MG/24HR TD PTTW: MEDICATED_PATCH | TRANSDERMAL | Status: DC

## 2013-05-01 MED ORDER — FLUCONAZOLE 150 MG PO TABS: 150.0000 mg | ORAL_TABLET | Freq: Once | ORAL | Status: DC

## 2013-05-01 NOTE — Addendum Note (Signed)
Addended by: Francee Piccolo C on: 05/01/2013 11:45 AM   Modules accepted: Orders

## 2013-05-01 NOTE — Patient Instructions (Signed)

## 2013-05-01 NOTE — Progress Notes (Signed)
57 y.o. [id number] MarriedCaucasianF here for annual exam.  Did have surgery to remove stone in bladder.  This was attached to a suture in the bladder most likely from hysterectomy.  Released from urology unless new issues.  No more hematuria since then.  No additional bladder infection.   Colonoscopy was negative except for 2 polyps.  Saw Dr. Leone Payor.  F/U 5 years.   Pt questions splenic aneurysm and stomach aneurysm? (sister and uncle had hx of these).  Reviewed CT with patient.  No evidence of either on CT.    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  On 12 day course of prednisone.  She feels much better on the steroid dose.  Still has four days left.  Follow-up scheduled Nov 3rd.  Dr. Jarold Motto did blood work about six weeks ago.  Dosage adjusted about after last labs.  Patient's last menstrual period was 07/06/1997.          Sexually active: yes  The current method of family planning is status post hysterectomy.    Exercising: yes  cardio and weights Smoker:  no  Health Maintenance: Pap:  8/11 History of abnormal Pap:  yes MMG:  04/27/13 patient does not have results yet Colonoscopy:  10/14 repeat in 5 years BMD:   Years ago-has one scheduled 05/10/13 TDaP:  9/13 Screening Labs: PCP, Hb today: PCP, Urine today: PCP   reports that she has never smoked. She has never used smokeless tobacco. She reports that she does not drink alcohol or use illicit drugs.  Past Medical History  Diagnosis Date  . Hypothyroidism   . Bladder stone   . S/P ablation operation for arrhythmia     1998 BY DR Graciela Husbands--  NO ISSUES SINCE  . History of syncope     S/P CARDIAC ABLATION OF ARRYTHMIA  . Arthritis   . Personal history of colonic adenomas 04/11/2013    Past Surgical History  Procedure Laterality Date  . Laparoscopic assisted vaginal hysterectomy  1999    W/ RIGHT SALPINGOOPHORECTOMY AND BLADDER TACK SUSPENSION  . Cardiac electrophysiology study and ablation  1998 (APPROX)  DR Graciela Husbands     ARRHYTHMIA (PT UNSURE TYPE BUT HAD SYNCOPAL EPISODES)  PER PT NO S &S SINCE  . Cystoscopy with biopsy N/A 02/21/2013    Procedure: CYSTOSCOPY WITH BIOPSY  Transurethral laser of bladder stone and foreign body;  Surgeon: Martina Sinner, MD;  Location: [facility];  Service: Urology;  Laterality: N/A;  60 mins req for this case   . Holmium laser application N/A 02/21/2013    Procedure: HOLMIUM LASER APPLICATION;  Surgeon: Martina Sinner, MD;  Location: [facility];  Service: Urology;  Laterality: N/A;  . Cardiac catheterization  2002    with ablation    Current Outpatient Prescriptions  Medication Sig Dispense Refill  . calcium carbonate (OS-CAL) 600 MG TABS tablet Take 600 mg by mouth daily. Chewable = vitamin d      . fish oil-omega-3 fatty acids 1000 MG capsule Take 1 g by mouth daily. Krill oil      . Glucosamine-Chondroitin (GLUCOSAMINE CHONDR COMPLEX PO) Take by mouth daily.      Marland Kitchen levothyroxine (SYNTHROID, LEVOTHROID) 175 MCG tablet Take 175 mcg by mouth daily before breakfast. 6 days weekly      . metroNIDAZOLE (METROGEL) 0.75 % gel Apply 1 application topically daily. For rosacea      . MINIVELLE 0.0375 MG/24HR apply 1 patch two times a week  8 patch  1  . mometasone (NASONEX) 50 MCG/ACT nasal spray Place 2 sprays into the nose daily as needed (for allergies). As needed      . Multiple Vitamin (MULTI-VITAMIN DAILY PO) Take 1 tablet by mouth daily.       . predniSONE (DELTASONE) 5 MG tablet 5 mg. Taper dose pack x 12 days      . zolpidem (AMBIEN) 10 MG tablet Take 5 mg by mouth at bedtime as needed for sleep.       No current facility-administered medications for this visit.    Family History  Problem Relation Age of Onset  . Other Mother     caricosities  . Hypertension Father   . Other Sister     caricosities  . Colon cancer Neg Hx     ROS:  Pertinent items are noted in HPI.  Otherwise, a comprehensive ROS was negative.  Exam:   BP 140/88  Pulse 64  Resp 20  Ht 5' 8.25"  (1.734 m)  Wt 167 lb (75.751 kg)  BMI 25.19 kg/m2  LMP 07/06/1997  Weight change: +5lb   Height: 5' 8.25" (173.4 cm)  Ht Readings from Last 3 Encounters:  05/01/13 5' 8.25" (1.734 m)  04/07/13 5' 8.5" (1.74 m)  03/29/13 5' 8.5" (1.74 m)    General appearance: alert, cooperative and appears stated age Head: Normocephalic, without obvious abnormality, atraumatic Neck: no adenopathy, supple, symmetrical, trachea midline and thyroid normal to inspection and palpation Lungs: clear to auscultation bilaterally Breasts: normal appearance, no masses or tenderness Heart: regular rate and rhythm Abdomen: soft, non-tender; bowel sounds normal; no masses,  no organomegaly Extremities: extremities normal, atraumatic, no cyanosis or edema Skin: Skin color, texture, turgor normal. No rashes or lesions Lymph nodes: Cervical, supraclavicular, and axillary nodes normal. No abnormal inguinal nodes palpated Neurologic: Grossly normal   Pelvic: External genitalia:  no lesions              Urethra:  normal appearing urethra with no masses, tenderness or lesions              Bartholins and Skenes: normal                 Vagina: normal appearing vagina with normal color and discharge, no lesions              Cervix: absent              Pap taken: no Bimanual Exam:  Uterus:  uterus absent              Adnexa: normal adnexa, no masses noted               Rectovaginal: Confirms               Anus:  normal sphincter tone, no lesions  A:  Well Woman with normal exam H/o hypothyroidism with recent medication adjustment. RLQ pain/fullness, intermittent.  Pt to call if occurs again for possible MRI.  Has undergone CT, cysto, stone removal, and colonoscopy H/O TAH/RSO, on low dose HRT H/O microscopic hematuria and recurrent UTIs which seem to have resolved.  P:   Mammogram done.  Will need follow-up.  Not scheduled yet. pap smear not indicated. Minivelle 0.0375mg  twice weekly.  #8/12 RF. Savings card  given. Rx for Diflucan given if pt has yeast infection. TSH today. return annually or prn  An After Visit Summary was printed and given to the patient.

## 2013-05-01 NOTE — Addendum Note (Signed)
Addended by: Jerene Bears on: 05/01/2013 12:08 PM   Modules accepted: Orders

## 2013-05-02 ENCOUNTER — Other Ambulatory Visit: Payer: Self-pay | Admitting: Obstetrics & Gynecology

## 2013-05-02 DIAGNOSIS — R928 Other abnormal and inconclusive findings on diagnostic imaging of breast: Secondary | ICD-10-CM

## 2013-05-03 ENCOUNTER — Encounter: Payer: Self-pay | Admitting: Obstetrics & Gynecology

## 2013-05-11 ENCOUNTER — Other Ambulatory Visit: Payer: Self-pay

## 2013-05-11 ENCOUNTER — Ambulatory Visit: Payer: BC Managed Care – PPO | Admitting: Obstetrics & Gynecology

## 2013-05-19 ENCOUNTER — Ambulatory Visit
Admission: RE | Admit: 2013-05-19 | Discharge: 2013-05-19 | Disposition: A | Discharge status: Home / Self Care | Payer: BC Managed Care – PPO | Source: Ambulatory Visit | Attending: Obstetrics & Gynecology | Admitting: Obstetrics & Gynecology

## 2013-05-19 DIAGNOSIS — R928 Other abnormal and inconclusive findings on diagnostic imaging of breast: Secondary | ICD-10-CM

## 2013-05-23 ENCOUNTER — Other Ambulatory Visit (HOSPITAL_COMMUNITY): Payer: Self-pay | Admitting: Internal Medicine

## 2013-05-23 DIAGNOSIS — R110 Nausea: Secondary | ICD-10-CM

## 2013-05-23 DIAGNOSIS — R109 Unspecified abdominal pain: Secondary | ICD-10-CM

## 2013-05-23 DIAGNOSIS — R140 Abdominal distension (gaseous): Secondary | ICD-10-CM

## 2013-05-25 ENCOUNTER — Other Ambulatory Visit: Payer: Self-pay | Admitting: Internal Medicine

## 2013-05-25 DIAGNOSIS — R109 Unspecified abdominal pain: Secondary | ICD-10-CM

## 2013-06-07 ENCOUNTER — Other Ambulatory Visit (HOSPITAL_BASED_OUTPATIENT_CLINIC_OR_DEPARTMENT_OTHER): Payer: Self-pay | Admitting: Internal Medicine

## 2013-06-07 ENCOUNTER — Other Ambulatory Visit (HOSPITAL_COMMUNITY): Payer: Self-pay | Admitting: Internal Medicine

## 2013-06-07 ENCOUNTER — Encounter (HOSPITAL_COMMUNITY)
Admission: RE | Admit: 2013-06-07 | Discharge: 2013-06-07 | Disposition: A | Discharge status: Home / Self Care | Payer: BC Managed Care – PPO | Source: Ambulatory Visit | Attending: Internal Medicine | Admitting: Internal Medicine

## 2013-06-07 DIAGNOSIS — R141 Gas pain: Secondary | ICD-10-CM | POA: Insufficient documentation

## 2013-06-07 DIAGNOSIS — R1031 Right lower quadrant pain: Secondary | ICD-10-CM

## 2013-06-07 DIAGNOSIS — R109 Unspecified abdominal pain: Secondary | ICD-10-CM

## 2013-06-07 DIAGNOSIS — R110 Nausea: Secondary | ICD-10-CM | POA: Insufficient documentation

## 2013-06-07 DIAGNOSIS — R140 Abdominal distension (gaseous): Secondary | ICD-10-CM

## 2013-06-07 DIAGNOSIS — R142 Eructation: Secondary | ICD-10-CM | POA: Insufficient documentation

## 2013-06-07 MED ORDER — TECHNETIUM TC 99M MEBROFENIN IV KIT
5.0000 | PACK | Freq: Once | INTRAVENOUS | Status: AC | PRN
  Administered 2013-06-07: 5 via INTRAVENOUS

## 2013-06-10 ENCOUNTER — Other Ambulatory Visit (HOSPITAL_BASED_OUTPATIENT_CLINIC_OR_DEPARTMENT_OTHER): Payer: BC Managed Care – PPO

## 2013-06-10 ENCOUNTER — Ambulatory Visit (HOSPITAL_BASED_OUTPATIENT_CLINIC_OR_DEPARTMENT_OTHER): Payer: BC Managed Care – PPO

## 2013-06-12 ENCOUNTER — Telehealth: Payer: Self-pay | Admitting: Obstetrics & Gynecology

## 2013-06-12 NOTE — Telephone Encounter (Signed)
Dr. Hyacinth Meeker,  Patient calling with ongoing RLQ pain, bloating, and nausea.  Recent HIDA scan completed as ordered by Dr. Eloise Harman. Dr. Silvano Rusk office reviewed results and suggested calling Dr. Hyacinth Meeker for next step. Patient wondering if needs referral to see a gastroenterologist. MRI was not approved by insurance company but patient thinking she may still need it. She is still active (works out) eating and drinking well but still continues with RLQ pain.

## 2013-06-12 NOTE — Telephone Encounter (Signed)
Still having issues with abdominal pain and wants to speak to nurse for guidance.

## 2013-06-13 NOTE — Telephone Encounter (Signed)
Patient is waiting to here back from a nurse regarding conversation yesterday.

## 2013-06-13 NOTE — Telephone Encounter (Signed)
Dr. Hyacinth Meeker, should I ask patient to come in for office visit to discuss?

## 2013-06-13 NOTE — Telephone Encounter (Signed)
Dr. Leone Payor, GI, did her colonoscopy earlier this year.  I left a message for her today to see if she wants to follow up with him or see Dr. Loreta Ave.  I asked her to return our call tomorrow.

## 2013-06-14 ENCOUNTER — Telehealth: Payer: Self-pay | Admitting: Internal Medicine

## 2013-06-14 NOTE — Telephone Encounter (Signed)
Patient reports that she is having abdominal pain.  She has been seeing her primary care for the same.  She has had several outpatient imaging studies over the last few months and a colon.  She will come in and see Mike Gip PA tomorrow at 11:00

## 2013-06-14 NOTE — Telephone Encounter (Signed)
Patient returned call. She will call Dr. Marvell Fuller office and see if she can set up another appointment. If cannot get in to see Dr. Leone Payor in a timely manner then will call to request referral to Dr. Loreta Ave.

## 2013-06-15 ENCOUNTER — Ambulatory Visit (INDEPENDENT_AMBULATORY_CARE_PROVIDER_SITE_OTHER): Payer: BC Managed Care – PPO | Admitting: Physician Assistant

## 2013-06-15 ENCOUNTER — Encounter: Payer: Self-pay | Admitting: Physician Assistant

## 2013-06-15 ENCOUNTER — Other Ambulatory Visit (INDEPENDENT_AMBULATORY_CARE_PROVIDER_SITE_OTHER): Payer: BC Managed Care – PPO

## 2013-06-15 VITALS — BP 110/72 | HR 76 | Ht 68.5 in | Wt 168.4 lb

## 2013-06-15 DIAGNOSIS — K5730 Diverticulosis of large intestine without perforation or abscess without bleeding: Secondary | ICD-10-CM

## 2013-06-15 DIAGNOSIS — R110 Nausea: Secondary | ICD-10-CM

## 2013-06-15 DIAGNOSIS — R109 Unspecified abdominal pain: Secondary | ICD-10-CM

## 2013-06-15 DIAGNOSIS — R1031 Right lower quadrant pain: Secondary | ICD-10-CM

## 2013-06-15 DIAGNOSIS — Z90710 Acquired absence of both cervix and uterus: Secondary | ICD-10-CM

## 2013-06-15 LAB — COMPREHENSIVE METABOLIC PANEL
ALT: 23 U/L (ref 0–35)
AST: 21 U/L (ref 0–37)
Albumin: 4 g/dL (ref 3.5–5.2)
BUN: 18 mg/dL (ref 6–23)
Chloride: 101 mEq/L (ref 96–112)
GFR: 77.91 mL/min (ref >60.00)
Glucose, Bld: 86 mg/dL (ref 70–99)
Potassium: 4.2 mEq/L (ref 3.5–5.1)
Sodium: 137 mEq/L (ref 135–145)

## 2013-06-15 LAB — CBC WITH DIFFERENTIAL/PLATELET
Basophils Absolute: 0 10*3/uL (ref 0.0–0.1)
Eosinophils Relative: 0.8 % (ref 0.0–5.0)
HCT: 42.9 % (ref 36.0–46.0)
Lymphocytes Relative: 29 % (ref 12.0–46.0)
Lymphs Abs: 1.7 10*3/uL (ref 0.7–4.0)
MCV: 86.3 fl (ref 78.0–100.0)
Monocytes Absolute: 0.5 10*3/uL (ref 0.1–1.0)
Monocytes Relative: 8.6 % (ref 3.0–12.0)
Neutro Abs: 3.6 10*3/uL (ref 1.4–7.7)
Neutrophils Relative %: 61.3 % (ref 43.0–77.0)
Platelets: 177 10*3/uL (ref 150.0–400.0)
RBC: 4.97 Mil/uL (ref 3.87–5.11)

## 2013-06-15 NOTE — Progress Notes (Signed)
Subjective:    Patient ID: Natalie Schultz, female    DOB: Sep 01, 1955, 57 y.o.   MRN: [id number]  HPI  Natalie Schultz  is a pleasant 57 year old white female known to Dr. Leone Schultz who underwent screening colonoscopy in October of 2014. She was found to have 2 small polyps one 2 mm one 6 mm both of which were removed. These were tubular adenomas. She also had moderate sigmoid diverticulosis. Patient comes in today for evaluation of persistent right-sided abdominal pain. She says she has had symptoms since early summer and that her symptoms have definitely progressed. Said initially her discomfort was intermittent and now she has pain fairly constantly in the right lower abdomen which at times is a twisting pressure-like sensation. She also has frequent nausea which is present daily. No vomiting. She has not noticed any changes in her bowel habits or any melena or hematochezia. She says it times her pain will radiate up into the right upper quadrant. Her appetite has been fine her weight has been stable has not noticed any changes postprandially or aggravation of her symptoms with eating.  Patient is status post hysterectomy and had her left ovary removed. She had CT scan of the abdomen and pelvis done with contrast in July of 2014 by her PCP and was found to have a small bladder wall calcification and a cystocele. No acute abdominal or pelvic findings were noted. She states that she has since seen a urologist and had a cystoscopy and removal of a small stone which was attached her bladder wall. She says this had no effect on her symptoms and was told by the urologist but this was not causing any of her pain. She also had [facility] HIDA scan done in November of 2014 which showed a normal gallbladder ejection fraction at 84%.    Review of Systems  Constitutional: Negative.   HENT: Negative.   Eyes: Negative.   Respiratory: Negative.   Cardiovascular: Negative.   Gastrointestinal: Positive for nausea and abdominal  pain.  Endocrine: Negative.   Genitourinary: Negative.   Musculoskeletal: Negative.   Skin: Negative.   Allergic/Immunologic: Negative.   Neurological: Negative.   Hematological: Negative.   Psychiatric/Behavioral: Negative.    Outpatient Prescriptions Prior to Visit  Medication Sig Dispense Refill  . calcium carbonate (OS-CAL) 600 MG TABS tablet Take 600 mg by mouth daily. Chewable = vitamin d      . estradiol (MINIVELLE) 0.0375 MG/24HR apply 1 patch two times a week  8 patch  12  . fish oil-omega-3 fatty acids 1000 MG capsule Take 1 g by mouth daily. Krill oil      . fluconazole (DIFLUCAN) 150 MG tablet Take 1 tablet (150 mg total) by mouth once. Take one tablet.  Repeat in 48 hours if symptoms are not completely resolved.  2 tablet  0  . Glucosamine-Chondroitin (GLUCOSAMINE CHONDR COMPLEX PO) Take by mouth daily.      Marland Kitchen levothyroxine (SYNTHROID, LEVOTHROID) 175 MCG tablet Take 175 mcg by mouth daily before breakfast. 6 days weekly      . metroNIDAZOLE (METROGEL) 0.75 % gel Apply 1 application topically daily. For rosacea      . mometasone (NASONEX) 50 MCG/ACT nasal spray Place 2 sprays into the nose daily as needed (for allergies). As needed      . Multiple Vitamin (MULTI-VITAMIN DAILY PO) Take 1 tablet by mouth daily.       Marland Kitchen zolpidem (AMBIEN) 10 MG tablet Take 5 mg by mouth at bedtime as  needed for sleep.      . predniSONE (DELTASONE) 5 MG tablet 5 mg. Taper dose pack x 12 days       No facility-administered medications prior to visit.   Allergies  Allergen Reactions  . Amoxicillin Hives and Shortness Of Breath  . Erythromycin Hives and Shortness Of Breath  . Biaxin [Clarithromycin] Hives   Patient Active Problem List   Diagnosis Date Noted  . Diverticulosis of colon without hemorrhage 06/15/2013  . S/P hysterectomy 06/15/2013  . Personal history of colonic adenomas 04/11/2013   History  Substance Use Topics  . Smoking status: Never Smoker   . Smokeless tobacco: Never  Used  . Alcohol Use: Yes     Comment: rare   family history includes Hypertension in her father; Other in her mother and sister; Prostate cancer in her father. There is no history of Colon cancer, Diabetes, Heart disease, Kidney disease, or Liver disease.     Objective:   Physical Exam   white female in no acute distress, pleasant blood pressure 110/72 pulse 75 height 5 foot 8 weight 168. HEENT; nontraumatic normocephalic EOMI PERRLA sclera anicteric, Supple; no JVD, Cardiovascular; regular rate and rhythm with S1-S2 no murmur or gallop, Pulmonary; clear bilaterally, Abdomen; soft nondistended bowel sounds are active she is tender in the right lower quadrant right mid quadrant there is no guarding or rebound no palpable mass or hepatosplenomegaly no hernia palpable, Rectal ;exam not done, Extremities; no clubbing cyanosis or edema skin warm and dry, Psych; mood and affect normal and appropriate        Assessment & Plan:  #59  57 year old female with at least 6 month history of right-sided lower abdominal pain somewhat progressive and associated with nausea. Recent colonoscopy was unrevealing as to source of her symptoms. She had imaging done in July with CT which also showed no acute findings. Etiology of her pain is not clear, sounds intestinal cannot rule out gallbladder. Am concerned that she may have an inflammatory process not appreciated when imaged in July  #2 adenomatous colon polyps due for followup colonoscopy October 2019 #3 diverticulosis #4 bladder stone status post cystoscopy and removal #5 status post hysterectomy and left oophorectomy   Plan; CBC with differential, CMETand CRP. Scheduled for MRI of the abdomen and pelvis. If this is unrevealing she will need further workup which may include EGD and upper abdominal ultrasound

## 2013-06-15 NOTE — Patient Instructions (Addendum)
Please go to the basement level to have your labs drawn.  We have scheduled the MRI/ ABD & PELVIS  At [facility] for 06-21-2013 . Time is 5 :00 PM. Arrive 4:45 PM.  Have nothing by mouth past 1:00 PM.   Go to registration,  Inside front door of [facility].

## 2013-06-16 NOTE — Progress Notes (Addendum)
Agree with Ms. Esterwood's assessment and plan. Nikko Quast E. Shiloh Swopes, MD, FACG   

## 2013-06-16 NOTE — Progress Notes (Deleted)
Agree with Ms. Guenther's assessment and plan. Bobbie Virden E. Krzysztof Reichelt, MD, FACG   

## 2013-06-21 ENCOUNTER — Ambulatory Visit (HOSPITAL_COMMUNITY): Admission: RE | Admit: 2013-06-21 | Payer: BC Managed Care – PPO | Source: Ambulatory Visit

## 2013-06-21 ENCOUNTER — Telehealth: Payer: Self-pay | Admitting: Gastroenterology

## 2013-06-21 NOTE — Telephone Encounter (Signed)
Left a message for patient with number to call to reschedule and to let me know what she does.

## 2013-06-21 NOTE — Telephone Encounter (Signed)
Patient has rescheduled to 06/27/13. She is waiting for approval from her insurance. She is asking if we have heard anything about this. Per Karolee Stamps, waiting on medical director to approve.

## 2013-06-27 ENCOUNTER — Ambulatory Visit (HOSPITAL_COMMUNITY): Payer: BC Managed Care – PPO

## 2013-06-27 ENCOUNTER — Ambulatory Visit (HOSPITAL_COMMUNITY)
Admission: RE | Admit: 2013-06-27 | Discharge: 2013-06-27 | Disposition: A | Discharge status: Home / Self Care | Payer: BC Managed Care – PPO | Source: Ambulatory Visit | Attending: Physician Assistant | Admitting: Physician Assistant

## 2013-06-27 DIAGNOSIS — R142 Eructation: Secondary | ICD-10-CM | POA: Insufficient documentation

## 2013-06-27 DIAGNOSIS — K458 Other specified abdominal hernia without obstruction or gangrene: Secondary | ICD-10-CM | POA: Insufficient documentation

## 2013-06-27 DIAGNOSIS — R110 Nausea: Secondary | ICD-10-CM

## 2013-06-27 DIAGNOSIS — R141 Gas pain: Secondary | ICD-10-CM | POA: Insufficient documentation

## 2013-06-27 DIAGNOSIS — R112 Nausea with vomiting, unspecified: Secondary | ICD-10-CM | POA: Insufficient documentation

## 2013-06-27 DIAGNOSIS — R1031 Right lower quadrant pain: Secondary | ICD-10-CM | POA: Insufficient documentation

## 2013-06-27 DIAGNOSIS — R109 Unspecified abdominal pain: Secondary | ICD-10-CM

## 2013-06-27 MED ORDER — GADOBENATE DIMEGLUMINE 529 MG/ML IV SOLN
15.0000 mL | Freq: Once | INTRAVENOUS | Status: AC | PRN
  Administered 2013-06-27: 15 mL via INTRAVENOUS

## 2013-07-03 ENCOUNTER — Telehealth: Payer: Self-pay | Admitting: Physician Assistant

## 2013-07-03 NOTE — Telephone Encounter (Signed)
Patient advised of MRI results.  She is still having pain and nausea.  She is scheduled for 07/11/13 3:30

## 2013-07-11 ENCOUNTER — Encounter: Payer: Self-pay | Admitting: Internal Medicine

## 2013-07-11 ENCOUNTER — Ambulatory Visit (INDEPENDENT_AMBULATORY_CARE_PROVIDER_SITE_OTHER): Payer: BC Managed Care – PPO | Admitting: Internal Medicine

## 2013-07-11 VITALS — BP 110/80 | HR 76 | Ht 68.0 in | Wt 166.4 lb

## 2013-07-11 DIAGNOSIS — M545 Low back pain, unspecified: Secondary | ICD-10-CM

## 2013-07-11 DIAGNOSIS — R1031 Right lower quadrant pain: Secondary | ICD-10-CM

## 2013-07-11 DIAGNOSIS — G8929 Other chronic pain: Secondary | ICD-10-CM | POA: Insufficient documentation

## 2013-07-11 DIAGNOSIS — M533 Sacrococcygeal disorders, not elsewhere classified: Secondary | ICD-10-CM

## 2013-07-11 MED ORDER — MELOXICAM 15 MG PO TABS: 15.0000 mg | ORAL_TABLET | Freq: Every day | ORAL | Status: DC

## 2013-07-11 MED ORDER — OMEPRAZOLE MAGNESIUM 20 MG PO TBEC: 20.0000 mg | DELAYED_RELEASE_TABLET | Freq: Every day | ORAL | Status: DC

## 2013-07-11 NOTE — Patient Instructions (Addendum)
Try over the counter Prilosec, one daily 30 minutes prior to your first meal of the day.  Take this while you are on Mobic.  Rx for Mobic sent in to [facility] on [address].  Please send [place] a My Chart message in a month to update [place] on your progress.   I appreciate the opportunity to care for you.

## 2013-07-11 NOTE — Assessment & Plan Note (Addendum)
Causes not clear. Nothing concerning on any imaging or colonoscopy. I explained to her that could be adhesions, or some inflammation internally that we cannot see. It does not seem to be an abdominal wall strain. We have agreed to try Mobic for a month, at 15 mg daily and cover with Prilosec OTC. If she is still having problems and I think consideration for laparoscopy through her gynecologist perhaps a surgeon may be the next that. I don't think I have anything else to off her from investigative or therapy standpoint.  CC: Donnajean Lopes, MD

## 2013-07-11 NOTE — Progress Notes (Signed)
Subjective:    Patient ID: Natalie Schultz, female    DOB: 1956-06-19, 58 y.o.   MRN: [id number]  HPI Patient is here for followup. She has been having problems with right lower quadrant pain that radiates up into the right upper quadrant epigastrium at times. It's not a very strong pain but is just Barrett's been very bothersome. As a twisting sensation at times. There is no constipation. She's been her gynecologist. She has had abdominal pelvic CT, HIDA scan with ejection fraction and MRI of the abdomen and pelvis all of which been unrevealing as to a cousin not showing anything problematic. A screening colonoscopy did not reveal the cause in October. She has been using some Advil at times and she thinks that helps a little bit. More recently she has developed some back pain but the abdominal pain preceded that. It does not really bother her at night, she is able to sleep.  Allergies  Allergen Reactions  . Amoxicillin Hives and Shortness Of Breath  . Erythromycin Hives and Shortness Of Breath  . Biaxin [Clarithromycin] Hives   Outpatient Prescriptions Prior to Visit  Medication Sig Dispense Refill  . calcium carbonate (OS-CAL) 600 MG TABS tablet Take 600 mg by mouth daily. Chewable = vitamin d      . estradiol (MINIVELLE) 0.0375 MG/24HR apply 1 patch two times a week  8 patch  12  . fish oil-omega-3 fatty acids 1000 MG capsule Take 1 g by mouth daily. Krill oil      . Glucosamine-Chondroitin (GLUCOSAMINE CHONDR COMPLEX PO) Take by mouth daily.      Marland Kitchen levothyroxine (SYNTHROID, LEVOTHROID) 175 MCG tablet Take 175 mcg by mouth daily before breakfast. 6 days weekly      . metroNIDAZOLE (METROGEL) 0.75 % gel Apply 1 application topically daily. For rosacea      . mometasone (NASONEX) 50 MCG/ACT nasal spray Place 2 sprays into the nose daily as needed (for allergies). As needed      . Multiple Vitamin (MULTI-VITAMIN DAILY PO) Take 1 tablet by mouth daily.       . Polyethylene Glycol 3350  (MIRALAX PO) Take 17 g by mouth daily as needed.      . zolpidem (AMBIEN) 10 MG tablet Take 5 mg by mouth at bedtime as needed for sleep.      . fluconazole (DIFLUCAN) 150 MG tablet Take 1 tablet (150 mg total) by mouth once. Take one tablet.  Repeat in 48 hours if symptoms are not completely resolved.  2 tablet  0   No facility-administered medications prior to visit.   Past Medical History  Diagnosis Date  . Hypothyroidism   . Bladder stone   . S/P ablation operation for arrhythmia     [place]  . History of syncope     S/P CARDIAC ABLATION OF ARRYTHMIA  . Arthritis   . Personal history of colonic adenomas 04/11/2013   Past Surgical History  Procedure Laterality Date  . Laparoscopic assisted vaginal hysterectomy  1999    W/ RIGHT SALPINGOOPHORECTOMY AND BLADDER TACK SUSPENSION  . Cardiac electrophysiology study and ablation  1998 (APPROX)  DR Caryl Comes    ARRHYTHMIA (PT UNSURE TYPE BUT HAD SYNCOPAL EPISODES)  PER PT NO S &S SINCE  . Cystoscopy with biopsy N/A 02/21/2013    Procedure: CYSTOSCOPY WITH BIOPSY  Transurethral laser of bladder stone and foreign body;  Surgeon: Reece Packer, MD;  Location: [facility];  Service: Urology;  Laterality: N/A;  60 mins req for this case   . Holmium laser application N/A 2/95/1884    Procedure: HOLMIUM LASER APPLICATION;  Surgeon: Reece Packer, MD;  Location: [facility];  Service: Urology;  Laterality: N/A;  . Cardiac catheterization  2002    with ablation  . Colonoscopy w/ polypectomy  04/07/2013   Review of Systems As per history of present illness    Objective:   Physical Exam General:  NAD Eyes:   anicteric Lungs:  clear Heart:  S1S2 no rubs, murmurs or gallops Abdomen:  soft and  Mildly tender rlq, BS+ Ext:   no edema    Data Reviewed:  As per history of present illness    Assessment & Plan:   1. Chronic RLQ pain   2. Back pain, lumbosacral

## 2013-08-14 ENCOUNTER — Encounter: Payer: Self-pay | Admitting: Internal Medicine

## 2013-08-14 ENCOUNTER — Encounter: Payer: Self-pay | Admitting: Obstetrics & Gynecology

## 2013-08-14 DIAGNOSIS — R1031 Right lower quadrant pain: Secondary | ICD-10-CM

## 2013-08-14 NOTE — Telephone Encounter (Signed)
Dr. Carlean Purl,  See message from the patient.  I did call and speak with the patient.  Her nausea has improved, but the bloating and RLQ pain is still present.  I reviewed your last office note with her . She will contact her GYN.  Do you have any additional recommendations?

## 2013-08-18 NOTE — Telephone Encounter (Signed)
Order placed for PT referral due to persistent RLQ pain, H/O LAVH/RSO, negative GI evaluation, CT scan showing finding in bladder which was removed and pt now cleared from urology.

## 2013-08-21 ENCOUNTER — Encounter: Payer: Self-pay | Admitting: Internal Medicine

## 2013-08-24 ENCOUNTER — Encounter: Payer: Self-pay | Admitting: Obstetrics & Gynecology

## 2013-08-28 ENCOUNTER — Telehealth: Payer: Self-pay | Admitting: Obstetrics & Gynecology

## 2013-08-28 NOTE — Telephone Encounter (Signed)
Left message advising patient that she is scheduled with Dr. Matilde Sprang on Wednesday, February 25 @ 1000. Provided telephone # for the office [phone] if she needs to reschedule appointment.

## 2013-09-25 ENCOUNTER — Encounter: Payer: Self-pay | Admitting: Obstetrics & Gynecology

## 2013-09-25 DIAGNOSIS — R635 Abnormal weight gain: Secondary | ICD-10-CM

## 2013-09-27 ENCOUNTER — Other Ambulatory Visit (INDEPENDENT_AMBULATORY_CARE_PROVIDER_SITE_OTHER): Payer: BC Managed Care – PPO

## 2013-09-27 DIAGNOSIS — R635 Abnormal weight gain: Secondary | ICD-10-CM

## 2013-09-27 LAB — TSH: TSH: 0.983 u[IU]/mL (ref 0.350–4.500)

## 2014-01-16 ENCOUNTER — Telehealth: Payer: Self-pay | Admitting: Obstetrics & Gynecology

## 2014-01-16 MED ORDER — FLUCONAZOLE 150 MG PO TABS: 150.0000 mg | ORAL_TABLET | Freq: Once | ORAL | Status: DC

## 2014-01-16 NOTE — Telephone Encounter (Signed)
Spoke with patient. She states she feels as thought she is having a yeast infection. States "I feel fine otherwise, but I know this is a yeast infection." Patient was in the grocery store at time of phone call so she was not able to give much more information, but denies fevers and abdominal pain.   Last visit 05/01/13  patient was given Diflucan to have on hand. Requesting that Dr. Sabra Heck place refill.  Advised I would send her request to Dr. Sabra Heck and call back with response. She is agreeable to this.

## 2014-01-16 NOTE — Telephone Encounter (Signed)
Patient calling to request a refill on Diflucan. She states, "It has been awhile since I got it filled but I know I have a yeast infection." The patient declined an appointment. She wants to speak with the nurse first.  [facility]

## 2014-01-16 NOTE — Telephone Encounter (Signed)
Patient advised of message from Dr. Sabra Heck.  Encounter is closed by Dr. Sabra Heck.

## 2014-01-16 NOTE — Telephone Encounter (Signed)
Rx sent to her pharmacy.  Please inform pt.  If symptoms don't improve, will need OV.  Thanks.

## 2014-02-20 ENCOUNTER — Telehealth: Payer: Self-pay | Admitting: Obstetrics & Gynecology

## 2014-02-22 ENCOUNTER — Encounter: Payer: Self-pay | Admitting: Certified Nurse Midwife

## 2014-02-22 ENCOUNTER — Ambulatory Visit (INDEPENDENT_AMBULATORY_CARE_PROVIDER_SITE_OTHER): Payer: BC Managed Care – PPO | Admitting: Certified Nurse Midwife

## 2014-02-22 VITALS — BP 120/82 | HR 72 | Resp 16 | Ht 68.25 in | Wt 170.0 lb

## 2014-02-22 DIAGNOSIS — B373 Candidiasis of vulva and vagina: Secondary | ICD-10-CM

## 2014-02-22 DIAGNOSIS — B3731 Acute candidiasis of vulva and vagina: Secondary | ICD-10-CM

## 2014-02-22 DIAGNOSIS — R350 Frequency of micturition: Secondary | ICD-10-CM

## 2014-02-22 LAB — POCT URINALYSIS DIPSTICK
BILIRUBIN UA: NEGATIVE
Blood, UA: NEGATIVE
GLUCOSE UA: NEGATIVE
KETONES UA: NEGATIVE
LEUKOCYTES UA: NEGATIVE
Nitrite, UA: NEGATIVE
Protein, UA: NEGATIVE
Urobilinogen, UA: NEGATIVE
pH, UA: 5

## 2014-02-22 MED ORDER — TERCONAZOLE 0.4 % VA CREA: 1.0000 | TOPICAL_CREAM | Freq: Every day | VAGINAL | Status: DC

## 2014-02-22 NOTE — Progress Notes (Signed)
58 yo Married [id number] white female here with complaint of vaginal symptoms of itching, burning, and increase discharge. Describes discharge as white, thick, no odor. Onset of symptoms 3-4 days ago. Denies new personal products or vaginal dryness. No STD concerns. Urinary symptoms none .   O:Healthy female WDWN Affect: normal, orientation x 3  Exam: Abdomen:soft, non tender Lymph node: no enlargement or tenderness Pelvic exam: External genital: red with exudate, no scaling BUS: negative Vagina: white thick no odorous discharge noted. Ph:4.5   ,Wet prep taken, vagina thin appearance,slightly red Cervix: absent Uterus: absent Adnexa:normal, non tender, no masses or fullness noted   Wet Prep results: positive for yeast Poct urine-neg  A:Yeast vaginitis/vulvitis Vaginal dryness   P:Discussed findings of yeast vaginitis/vulvitis and etiology. Discussed Aveeno or baking soda sitz bath for comfort. Avoid moist clothes or pads for extended period of time. If working out in gym clothes or swim suits for long periods of time change underwear or bottoms of swimsuit if possible. Coconut oil use for skin protection prior to activity can be used to external skin. Discussed routine use for sexual activity and vaginal dryness weekly. Rx: Terazol 7 see order Aveeno anti itch cream with moisture OTC  Rv prn

## 2014-02-22 NOTE — Progress Notes (Signed)
Reviewed personally.  M. Suzanne Dmetrius Ambs, MD.  

## 2014-02-23 ENCOUNTER — Other Ambulatory Visit: Payer: Self-pay | Admitting: Dermatology

## 2014-03-23 NOTE — Telephone Encounter (Signed)
mmm

## 2014-05-01 ENCOUNTER — Other Ambulatory Visit: Payer: Self-pay | Admitting: Obstetrics & Gynecology

## 2014-05-01 DIAGNOSIS — Z1231 Encounter for screening mammogram for malignant neoplasm of breast: Secondary | ICD-10-CM

## 2014-05-07 ENCOUNTER — Encounter: Payer: Self-pay | Admitting: Certified Nurse Midwife

## 2014-05-15 ENCOUNTER — Encounter: Payer: Self-pay | Admitting: Certified Nurse Midwife

## 2014-05-15 ENCOUNTER — Ambulatory Visit (INDEPENDENT_AMBULATORY_CARE_PROVIDER_SITE_OTHER): Payer: BC Managed Care – PPO | Admitting: Certified Nurse Midwife

## 2014-05-15 VITALS — BP 110/70 | HR 72 | Resp 16 | Ht 68.25 in | Wt 164.0 lb

## 2014-05-15 DIAGNOSIS — N951 Menopausal and female climacteric states: Secondary | ICD-10-CM

## 2014-05-15 DIAGNOSIS — Z01419 Encounter for gynecological examination (general) (routine) without abnormal findings: Secondary | ICD-10-CM

## 2014-05-15 MED ORDER — ESTRADIOL 0.0375 MG/24HR TD PTTW: MEDICATED_PATCH | TRANSDERMAL | Status: DC

## 2014-05-15 NOTE — Progress Notes (Signed)
58 y.o. [id number] Married Caucasian Fe here for annual exam.  Menopausal on HRT, denies vaginal bleeding or vaginal dryness. Sees PCP for aex,labs and Hypothyroid management. No health changes with most recent exam per patient. Patient has been working out more with upper body and noticed left breast?chest wall soreness a few days ago. Wears underwire bras and also sports bra with working out. Denies nipple discharge or any breast change bilateral. Has not increased in intensity and ? Decrease. Has mammogram scheduled 06/04/14. No other health issues today.  Patient's last menstrual period was 07/06/1997.          Sexually active: Yes.    The current method of family planning is status post hysterectomy.    Exercising: Yes.    eliptical & bike, walking Smoker:  no  Health Maintenance: Pap: 8/11 neg per patient MMG:  04-27-13 category c density, birads category 0, f/u left breast 11/14 neg Colonoscopy:  10/14 repeat 5 yrs BMD:   2014 TDaP:  9/13 Labs: pcp Self breast exam: not done   reports that she has never smoked. She has never used smokeless tobacco. She reports that she does not drink alcohol or use illicit drugs.  Past Medical History  Diagnosis Date  . Hypothyroidism   . Bladder stone   . S/P ablation operation for arrhythmia     [place]  . History of syncope     S/P CARDIAC ABLATION OF ARRYTHMIA  . Arthritis   . Personal history of colonic adenomas 04/11/2013    Past Surgical History  Procedure Laterality Date  . Laparoscopic assisted vaginal hysterectomy  1999    W/ RIGHT SALPINGOOPHORECTOMY AND BLADDER TACK SUSPENSION  . Cardiac electrophysiology study and ablation  1998 (APPROX)  DR Caryl Comes    ARRHYTHMIA (PT UNSURE TYPE BUT HAD SYNCOPAL EPISODES)  PER PT NO S &S SINCE  . Cystoscopy with biopsy N/A 02/21/2013    Procedure: CYSTOSCOPY WITH BIOPSY  Transurethral laser of bladder stone and foreign body;  Surgeon: Reece Packer, MD;  Location: [facility];  Service: Urology;  Laterality: N/A;  60 mins req for this case   . Holmium laser application N/A 8/75/6433    Procedure: HOLMIUM LASER APPLICATION;  Surgeon: Reece Packer, MD;  Location: [facility];  Service: Urology;  Laterality: N/A;  . Cardiac catheterization  2002    with ablation  . Colonoscopy w/ polypectomy  04/07/2013    Current Outpatient Prescriptions  Medication Sig Dispense Refill  . aspirin 81 MG tablet Take 162 mg by mouth daily.    . calcium carbonate (OS-CAL) 600 MG TABS tablet Take 600 mg by mouth daily. Chewable = vitamin d    . estradiol (MINIVELLE) 0.0375 MG/24HR apply 1 patch two times a week 8 patch 12  . fish oil-omega-3 fatty acids 1000 MG capsule Take 1 g by mouth daily. Krill oil    . levothyroxine (SYNTHROID, LEVOTHROID) 175 MCG tablet Take 175 mcg by mouth daily before breakfast. 6 days weekly    . meloxicam (MOBIC) 15 MG tablet Take 1 tablet (15 mg total) by mouth daily. 30 tablet 1  . metroNIDAZOLE (METROGEL) 0.75 % gel Apply 1 application topically daily. For rosacea    . mometasone (NASONEX) 50 MCG/ACT nasal spray Place 2 sprays into the nose daily as needed (for allergies). As needed    . Multiple Vitamin (MULTI-VITAMIN DAILY PO) Take 1 tablet by mouth daily.     Marland Kitchen omeprazole (  PRILOSEC) 20 MG capsule Take 20 mg by mouth daily.    . Psyllium (METAMUCIL PO) Take by mouth. 1tsp 3 times daily    . Tretinoin (RETIN-A EX) Apply topically. cream    . UNABLE TO FIND daily. instaflex advance    . zolpidem (AMBIEN) 10 MG tablet Take 5 mg by mouth at bedtime as needed for sleep.     No current facility-administered medications for this visit.    Family History  Problem Relation Age of Onset  . Other Mother     caricosities  . Hypertension Father   . Other Sister     caricosities  . Colon cancer Neg Hx   . Diabetes Neg Hx   . Heart disease Neg Hx   . Kidney disease Neg Hx   . Liver disease Neg Hx   . Prostate cancer Father     ROS:  Pertinent  items are noted in HPI.  Otherwise, a comprehensive ROS was negative.  Exam:   BP 110/70 mmHg  Pulse 72  Resp 16  Ht 5' 8.25" (1.734 m)  Wt 164 lb (74.39 kg)  BMI 24.74 kg/m2  LMP 07/06/1997 Height: 5' 8.25" (173.4 cm)  Ht Readings from Last 3 Encounters:  05/15/14 5' 8.25" (1.734 m)  02/22/14 5' 8.25" (1.734 m)  07/11/13 5\' 8"  (1.727 m)    General appearance: alert, cooperative and appears stated age Head: Normocephalic, without obvious abnormality, atraumatic Neck: no adenopathy, supple, symmetrical, trachea midline and thyroid normal to inspection and palpation Lungs: clear to auscultation bilaterally Breasts: normal appearance, no masses or tenderness, No nipple retraction or dimpling, No nipple discharge or bleeding, No axillary or supraclavicular adenopathy Left chest wall tenderness Heart: regular rate and rhythm Abdomen: soft, non-tender; no masses,  no organomegaly Extremities: extremities normal, atraumatic, no cyanosis or edema Skin: Skin color, texture, turgor normal. No rashes or lesions Lymph nodes: Cervical, supraclavicular, and axillary nodes normal. No abnormal inguinal nodes palpated Neurologic: Grossly normal   Pelvic: External genitalia:  no lesions              Urethra:  normal appearing urethra with no masses, tenderness or lesions              Bartholin's and Skene's: normal                 Vagina: normal appearing vagina with normal color and discharge, no lesions, known cystocele noted, no change, grade 2              Cervix: absent              Pap taken: No. Bimanual Exam:  Uterus:  uterus absent              Adnexa: no mass, fullness, tenderness and right adnexal absent, left normal, no masses               Rectovaginal: Confirms               Anus:  normal sphincter tone, no lesions  A:  Well Woman with normal exam  Menopausal on HRT desires continuance  Hypothyroid stable medication  Left chest wall tenderness, normal breast exam  P:    Reviewed health and wellness pertinent to exam  Discussed risks and benefits of HRT and need to consider decrease of dose and weaning off to be stopped by 59. Discussed WHI and recommendations. Patient agreeable to what is best for her.  Rx Vivelle Dot see  order   Continue follow up as indicated  Discussed area seems to be more chest wall related and normal breast exam. Decrease lifting and avoid underwire bra. If symptoms increase and do not resolve needs to advise. Stressed keeping mammogram appointment.  Pap smear not taken today  counseled on breast self exam, mammography screening, use and side effects of HRT, adequate intake of calcium and vitamin D, diet and exercise  return annually or prn  An After Visit Summary was printed and given to the patient.

## 2014-05-15 NOTE — Patient Instructions (Signed)
EXERCISE AND DIET:  We recommended that you start or continue a regular exercise program for good health. Regular exercise means any activity that makes your heart beat faster and makes you sweat.  We recommend exercising at least 30 minutes per day at least 3 days a week, preferably 4 or 5.  We also recommend a diet low in fat and sugar.  Inactivity, poor dietary choices and obesity can cause diabetes, heart attack, stroke, and kidney damage, among others.    ALCOHOL AND SMOKING:  Women should limit their alcohol intake to no more than 7 drinks/beers/glasses of wine (combined, not each!) per week. Moderation of alcohol intake to this level decreases your risk of breast cancer and liver damage. And of course, no recreational drugs are part of a healthy lifestyle.  And absolutely no smoking or even second hand smoke. Most people know smoking can cause heart and lung diseases, but did you know it also contributes to weakening of your bones? Aging of your skin?  Yellowing of your teeth and nails?  CALCIUM AND VITAMIN D:  Adequate intake of calcium and Vitamin D are recommended.  The recommendations for exact amounts of these supplements seem to change often, but generally speaking 600 mg of calcium (either carbonate or citrate) and 800 units of Vitamin D per day seems prudent. Certain women may benefit from higher intake of Vitamin D.  If you are among these women, your doctor will have told you during your visit.    PAP SMEARS:  Pap smears, to check for cervical cancer or precancers,  have traditionally been done yearly, although recent scientific advances have shown that most women can have pap smears less often.  However, every woman still should have a physical exam from her gynecologist every year. It will include a breast check, inspection of the vulva and vagina to check for abnormal growths or skin changes, a visual exam of the cervix, and then an exam to evaluate the size and shape of the uterus and  ovaries.  And after 60 years of age, a rectal exam is indicated to check for rectal cancers. We will also provide age appropriate advice regarding health maintenance, like when you should have certain vaccines, screening for sexually transmitted diseases, bone density testing, colonoscopy, mammograms, etc.   MAMMOGRAMS:  All women over 40 years old should have a yearly mammogram. Many facilities now offer a "3D" mammogram, which may cost around $50 extra out of pocket. If possible,  we recommend you accept the option to have the 3D mammogram performed.  It both reduces the number of women who will be called back for extra views which then turn out to be normal, and it is better than the routine mammogram at detecting truly abnormal areas.    COLONOSCOPY:  Colonoscopy to screen for colon cancer is recommended for all women at age 50.  We know, you hate the idea of the prep.  We agree, BUT, having colon cancer and not knowing it is worse!!  Colon cancer so often starts as a polyp that can be seen and removed at colonscopy, which can quite literally save your life!  And if your first colonoscopy is normal and you have no family history of colon cancer, most women don't have to have it again for 10 years.  Once every ten years, you can do something that may end up saving your life, right?  We will be happy to help you get it scheduled when you are ready.    Be sure to check your insurance coverage so you understand how much it will cost.  It may be covered as a preventative service at no cost, but you should check your particular policy.    Hormone Therapy At menopause, your body begins making less estrogen and progesterone hormones. This causes the body to stop having menstrual periods. This is because estrogen and progesterone hormones control your periods and menstrual cycle. A lack of estrogen may cause symptoms such as:  Hot flushes (or hot flashes).  Vaginal dryness.  Dry skin.  Loss of sex  drive.  Risk of bone loss (osteoporosis). When this happens, you may choose to take hormone therapy to get back the estrogen lost during menopause. When the hormone estrogen is given alone, it is usually referred to as ET (Estrogen Therapy). When the hormone progestin is combined with estrogen, it is generally called HT (Hormone Therapy). This was formerly known as hormone replacement therapy (HRT). Your caregiver can help you make a decision on what will be best for you. The decision to use HT seems to change often as new studies are done. Many studies do not agree on the benefits of hormone replacement therapy. LIKELY BENEFITS OF HT INCLUDE PROTECTION FROM:  Hot Flushes (also called hot flashes) - A hot flush is a sudden feeling of heat that spreads over the face and body. The skin may redden like a blush. It is connected with sweats and sleep disturbance. Women going through menopause may have hot flushes a few times a month or several times per day depending on the woman.  Osteoporosis (bone loss)- Estrogen helps guard against bone loss. After menopause, a woman's bones slowly lose calcium and become weak and brittle. As a result, bones are more likely to break. The hip, wrist, and spine are affected most often. Hormone therapy can help slow bone loss after menopause. Weight bearing exercise and taking calcium with vitamin D also can help prevent bone loss. There are also medications that your caregiver can prescribe that can help prevent osteoporosis.  Vaginal Dryness - Loss of estrogen causes changes in the vagina. Its lining may become thin and dry. These changes can cause pain and bleeding during sexual intercourse. Dryness can also lead to infections. This can cause burning and itching. (Vaginal estrogen treatment can help relieve pain, itching, and dryness.)  Urinary Tract Infections are more common after menopause because of lack of estrogen. Some women also develop urinary incontinence  because of low estrogen levels in the vagina and bladder.  Possible other benefits of estrogen include a positive effect on mood and short-term memory in women. RISKS AND COMPLICATIONS  Using estrogen alone without progesterone causes the lining of the uterus to grow. This increases the risk of lining of the uterus (endometrial) cancer. Your caregiver should give another hormone called progestin if you have a uterus.  Women who take combined (estrogen and progestin) HT appear to have an increased risk of breast cancer. The risk appears to be small, but increases throughout the time that HT is taken.  Combined therapy also makes the breast tissue slightly denser which makes it harder to read mammograms (breast X-rays).  Combined, estrogen and progesterone therapy can be taken together every day, in which case there may be spotting of blood. HT therapy can be taken cyclically in which case you will have menstrual periods. Cyclically means HT is taken for a set amount of days, then not taken, then this process is repeated.  HT may   increase the risk of stroke, heart attack, breast cancer and forming blood clots in your leg.  Transdermal estrogen (estrogen that is absorbed through the skin with a patch or a cream) may have more positive results with:  Cholesterol.  Blood pressure.  Blood clots. Having the following conditions may indicate you should not have HT:  Endometrial cancer.  Liver disease.  Breast cancer.  Heart disease.  History of blood clots.  Stroke. TREATMENT   If you choose to take HT and have a uterus, usually estrogen and progestin are prescribed.  Your caregiver will help you decide the best way to take the medications.  Possible ways to take estrogen include:  Pills.  Patches.  Gels.  Sprays.  Vaginal estrogen cream, rings and tablets.  It is best to take the lowest dose possible that will help your symptoms and take them for the shortest period of  time that you can.  Hormone therapy can help relieve some of the problems (symptoms) that affect women at menopause. Before making a decision about HT, talk to your caregiver about what is best for you. Be well informed and comfortable with your decisions. HOME CARE INSTRUCTIONS   Follow your caregivers advice when taking the medications.  A Pap test is done to screen for cervical cancer.  The first Pap test should be done at age 21.  Between ages 21 and 29, Pap tests are repeated every 2 years.  Beginning at age 30, you are advised to have a Pap test every 3 years as long as your past 3 Pap tests have been normal.  Some women have medical problems that increase the chance of getting cervical cancer. Talk to your caregiver about these problems. It is especially important to talk to your caregiver if a new problem develops soon after your last Pap test. In these cases, your caregiver may recommend more frequent screening and Pap tests.  The above recommendations are the same for women who have or have not gotten the vaccine for HPV (Human Papillomavirus).  If you had a hysterectomy for a problem that was not a cancer or a condition that could lead to cancer, then you no longer need Pap tests. However, even if you no longer need a Pap test, a regular exam is a good idea to make sure no other problems are starting.   If you are between ages 65 and 70, and you have had normal Pap tests going back 10 years, you no longer need Pap tests. However, even if you no longer need a Pap test, a regular exam is a good idea to make sure no other problems are starting.   If you have had past treatment for cervical cancer or a condition that could lead to cancer, you need Pap tests and screening for cancer for at least 20 years after your treatment.  If Pap tests have been discontinued, risk factors (such as a new sexual partner) need to be re-assessed to determine if screening should be  resumed.  Some women may need screenings more often if they are at high risk for cervical cancer.  Get mammograms done as per the advice of your caregiver. SEEK IMMEDIATE MEDICAL CARE IF:  You develop abnormal vaginal bleeding.  You have pain or swelling in your legs, shortness of breath, or chest pain.  You develop dizziness or headaches.  You have lumps or changes in your breasts or armpits.  You have slurred speech.  You develop weakness or numbness of   your arms or legs.  You have pain, burning, or bleeding when urinating.  You develop abdominal pain. Document Released: 03/21/2003 Document Revised: 09/14/2011 Document Reviewed: 07/09/2010 ExitCare Patient Information 2015 [employer]. This information is not intended to replace advice given to you by your health care provider. Make sure you discuss any questions you have with your health care provider.  

## 2014-05-15 NOTE — Progress Notes (Signed)
Reviewed personally.  M. Suzanne Neena Beecham, MD.  

## 2014-05-25 ENCOUNTER — Ambulatory Visit: Payer: BC Managed Care – PPO | Admitting: Obstetrics & Gynecology

## 2014-06-04 ENCOUNTER — Telehealth: Payer: Self-pay | Admitting: Certified Nurse Midwife

## 2014-06-04 ENCOUNTER — Ambulatory Visit (HOSPITAL_COMMUNITY)
Admission: RE | Admit: 2014-06-04 | Discharge: 2014-06-04 | Disposition: A | Discharge status: Home / Self Care | Payer: BC Managed Care – PPO | Source: Ambulatory Visit | Attending: Obstetrics & Gynecology | Admitting: Obstetrics & Gynecology

## 2014-06-04 ENCOUNTER — Other Ambulatory Visit: Payer: Self-pay | Admitting: Certified Nurse Midwife

## 2014-06-04 DIAGNOSIS — Z1231 Encounter for screening mammogram for malignant neoplasm of breast: Secondary | ICD-10-CM | POA: Diagnosis not present

## 2014-06-04 DIAGNOSIS — N951 Menopausal and female climacteric states: Secondary | ICD-10-CM

## 2014-06-04 NOTE — Telephone Encounter (Signed)
Patient should be using once weekly which will decrease her total dose. She has refills using the same dosage but only once a week not twice weekly

## 2014-06-04 NOTE — Telephone Encounter (Signed)
estradiol (MINIVELLE) 0.0375 MG/24HR  Apply one patch weekly, Normal, Last Dose: Not Recorded  Refills: 12 ordered Pharmacy: [facility] [place]  Patient came in for a visit recently. She is calling about a lower dose on the above medication. The patient says the pharmacy does not have the RX. Please advise?

## 2014-06-04 NOTE — Telephone Encounter (Signed)
05/15/14 #8 patches/11 of the 0.0375 mg rfs was sent at [facility], the note stated that she discussed the need to decrease dose and to wean off by age 58. A different patch dosage was not sent in?  Called patient and she stated that Ms. Debbie and her talked about decreasing her dosage during her AEX. I told the patient that I would have to check with Ms. Debbie tomorrow since she is out of the office today. Patient said that's okay and just to call her back because she is on her last patch.  I called the pharmacy to see if they received the rx they said they didn't.  Ms. Jackelyn Poling please advise.

## 2014-06-05 MED ORDER — ESTRADIOL 0.0375 MG/24HR TD PTTW: MEDICATED_PATCH | TRANSDERMAL | Status: DC

## 2014-06-05 NOTE — Telephone Encounter (Signed)
Called pt and notified her of Mrs Debbie's note. She will use patch once a week.  Rx sent again to pharmacy.

## 2014-06-05 NOTE — Addendum Note (Signed)
Addended by: Emelia Salisbury C on: 06/05/2014 11:13 AM   Modules accepted: Orders

## 2014-06-18 ENCOUNTER — Telehealth: Payer: Self-pay | Admitting: Certified Nurse Midwife

## 2014-06-18 NOTE — Telephone Encounter (Signed)
Spoke with patient. Patient states that she has been experiencing "burning" with her hemorrhoids for 1 week. Patient is currently taking metamucil daily, using preparation H cream and wipes with no relief. Began to have vaginal itching and discharge yesterday. Patient would like to come in for appointment to see Natalie Schultz CNM. Will be helping with mobile meals tomorrow morning. Requesting a afternoon appointment tomorrow. Appointment scheduled for tomorrow 12/15 at 3:15pm with Natalie Schultz CNM. Patient is agreeable to date and time.  Routing to provider for final review. Patient agreeable to disposition. Will close encounter

## 2014-06-18 NOTE — Telephone Encounter (Signed)
Pt would like to be seen today if possible for a yeast infection and hemorrhoids.

## 2014-06-19 ENCOUNTER — Ambulatory Visit (INDEPENDENT_AMBULATORY_CARE_PROVIDER_SITE_OTHER): Payer: BC Managed Care – PPO | Admitting: Certified Nurse Midwife

## 2014-06-19 ENCOUNTER — Encounter: Payer: Self-pay | Admitting: Certified Nurse Midwife

## 2014-06-19 VITALS — BP 122/72 | HR 70 | Resp 16 | Ht 68.25 in | Wt 165.0 lb

## 2014-06-19 DIAGNOSIS — K649 Unspecified hemorrhoids: Secondary | ICD-10-CM

## 2014-06-19 MED ORDER — HYDROCORTISONE 2.5 % RE CREA: 1.0000 "application " | TOPICAL_CREAM | Freq: Three times a day (TID) | RECTAL | Status: DC

## 2014-06-19 NOTE — Progress Notes (Signed)
58 y.o. married white female [id number] here with complaint of hemorrhoid lsymptoms of irritation and slight burning. Has been using Preparation H gel with some relief. Also has used Tucks wipes. Patient has been standing for prolonged periods and feels this has caused the problem again.Denies rectal bleeding or dark tarry stools. Eating better diet now. Vaginal symptoms are related to wiping with Tucks only. No concerns. Onset of symptoms 4 days ago. Has increased water in diet. Has family coming in for holiday!  O:Healthy female WDWN Affect: normal, orientation x 3  Exam: Abdomen:soft non tender Lymph node: no enlargement or tenderness Pelvic exam: External genital: normal female, no lesions  BUS: negative Vagina: normal appearance discharge noted.  Cervix: absent Uterus:absent Adnexa: absent, no fullness Rectal: small inflamed hemorrhoid noted at 7 o'clock, tender to touch, not thrombosed. Slight increase pink around anal opening from wiping.     A:Hemorrhoid   P:Discussed findings of hemorrhoid and etiology. Discussed Aveeno or baking soda sitz bath for comfort. Discussed colace daily for soft stools and increase water in diet. Warning signs of hemorrhoid given. Rx Anusol HC cream see order.  RV prn

## 2014-06-19 NOTE — Patient Instructions (Signed)

## 2014-06-20 NOTE — Progress Notes (Signed)
Reviewed personally.  M. Suzanne Juleah Paradise, MD.  

## 2014-11-15 ENCOUNTER — Telehealth: Payer: Self-pay | Admitting: Certified Nurse Midwife

## 2014-11-15 DIAGNOSIS — N951 Menopausal and female climacteric states: Secondary | ICD-10-CM

## 2014-11-15 MED ORDER — ESTRADIOL 0.0375 MG/24HR TD PTTW: MEDICATED_PATCH | TRANSDERMAL | Status: DC

## 2014-11-15 NOTE — Telephone Encounter (Signed)
Rx for Minivelle 0.0375 mg place one patch weekly #8 2RF sent to pharmacy on file. Patient is agreeable.  Routing to provider for final review. Patient agreeable to disposition. Patient aware provider will review message and nurse will return call with any additional instructions or change of disposition. Will close encounter.

## 2014-11-15 NOTE — Telephone Encounter (Signed)
Yes, can you put order in

## 2014-11-15 NOTE — Telephone Encounter (Signed)
Regina Eck CNM patient requesting to switch back to [facility] due to [place] .0375mg   patch not sticking well. Okay to switch back at this time?

## 2014-11-15 NOTE — Telephone Encounter (Signed)
Patient calling requesting to switch back to [facility] due to her current generic patch not sticking for more than a couple days. Pharmacy on file is correct.

## 2015-02-08 ENCOUNTER — Telehealth: Payer: Self-pay | Admitting: Certified Nurse Midwife

## 2015-02-08 ENCOUNTER — Other Ambulatory Visit: Payer: Self-pay | Admitting: Certified Nurse Midwife

## 2015-02-08 DIAGNOSIS — T3695XA Adverse effect of unspecified systemic antibiotic, initial encounter: Principal | ICD-10-CM

## 2015-02-08 DIAGNOSIS — B379 Candidiasis, unspecified: Secondary | ICD-10-CM

## 2015-02-08 MED ORDER — FLUCONAZOLE 150 MG PO TABS: 150.0000 mg | ORAL_TABLET | Freq: Once | ORAL | Status: DC

## 2015-02-08 NOTE — Telephone Encounter (Signed)
Spoke with patient. Patient states that she is currently on an antibiotic for a root canal that she had performed. Is currently having vaginal itching and thick white discharge. Requesting rx for Diflucan to pharmacy on file. Advised will speak with Regina Eck CNM regarding medication request and return call. Patient is agreeable.

## 2015-02-08 NOTE — Telephone Encounter (Signed)
Spoke with patient. Advised of message as seen below from [place]. Patient is agreeable and verbalizes understanding.  Routing to provider for final review. Patient agreeable to disposition. Will close encounter.   Patient aware provider will review message and nurse will return call if any additional advice or change of disposition.

## 2015-02-08 NOTE — Telephone Encounter (Signed)
Patient has a yeast infection after being on antibiotics for a root canal. Would like something called in.

## 2015-02-08 NOTE — Telephone Encounter (Signed)
Ok to do Diflucan order in, if symptoms persist will need OV.

## 2015-04-19 ENCOUNTER — Other Ambulatory Visit: Payer: Self-pay | Admitting: Certified Nurse Midwife

## 2015-04-19 NOTE — Telephone Encounter (Signed)
Medication refill request: Vivelle Dot. Pt on Minivelle patch  Last AEX:  05/15/14 DL Next AEX: 07/22/14 DL Last MMG (if hormonal medication request): 11/130/15 [id number]:Neg Refill authorized: 11/15/14 #8ptch / 2Refills. To [address]

## 2015-05-07 ENCOUNTER — Other Ambulatory Visit: Payer: Self-pay | Admitting: Certified Nurse Midwife

## 2015-05-07 DIAGNOSIS — N951 Menopausal and female climacteric states: Secondary | ICD-10-CM

## 2015-05-07 MED ORDER — ESTRADIOL 0.0375 MG/24HR TD PTTW: MEDICATED_PATCH | TRANSDERMAL | Status: DC

## 2015-05-07 NOTE — Telephone Encounter (Signed)
Patient says her request for "patch" refill has been denied. Patient is asking why? Patient has an aex scheduled. Patient is asking for a return call. See previous request.

## 2015-05-07 NOTE — Telephone Encounter (Signed)
Patient is aware that her RX has been sent in -eh

## 2015-05-07 NOTE — Telephone Encounter (Signed)
Medication refill request: minivelle patch Last AEX:  05-15-14 Next AEX:[id number] Last MMG (if hormonal medication request):06-04-14 WNL Refill authorized: please advise

## 2015-05-23 ENCOUNTER — Encounter: Payer: Self-pay | Admitting: Certified Nurse Midwife

## 2015-05-23 ENCOUNTER — Ambulatory Visit (INDEPENDENT_AMBULATORY_CARE_PROVIDER_SITE_OTHER): Payer: BLUE CROSS/BLUE SHIELD | Admitting: Certified Nurse Midwife

## 2015-05-23 ENCOUNTER — Ambulatory Visit: Payer: Self-pay | Admitting: Certified Nurse Midwife

## 2015-05-23 VITALS — BP 120/80 | HR 68 | Resp 16 | Ht 68.0 in | Wt 161.0 lb

## 2015-05-23 DIAGNOSIS — N951 Menopausal and female climacteric states: Secondary | ICD-10-CM

## 2015-05-23 DIAGNOSIS — N816 Rectocele: Secondary | ICD-10-CM

## 2015-05-23 DIAGNOSIS — Z01419 Encounter for gynecological examination (general) (routine) without abnormal findings: Secondary | ICD-10-CM | POA: Diagnosis not present

## 2015-05-23 DIAGNOSIS — N8110 Cystocele, unspecified: Secondary | ICD-10-CM

## 2015-05-23 MED ORDER — ESTRADIOL 0.025 MG/24HR TD PTTW: 1.0000 | MEDICATED_PATCH | TRANSDERMAL | Status: DC

## 2015-05-23 NOTE — Progress Notes (Signed)
59 y.o. [id number] Married  Caucasian Fe here for annual exam. Menopausal on ERT would like to continue.. Denies vaginal bleeding. Sees PCP for labs/aex/Hypothyroid management. Has moved into new house! Now will accommodate all the big family. Continues to having leaking with cough or sneeze. Feels like things have changed in vaginal area. Had previous bladder suspension repair with hysterectomy in 1999. Occasional vaginal dryness, using Olive Oil. No other health issues today.  Patient's last menstrual period was 07/06/1997.          Sexually active: Yes.    The current method of family planning is status post hysterectomy.    Exercising: Yes.    cardio,yoga,stretching Smoker:  no  Health Maintenance: Pap:  8/11 neg per patient MMG:  05-25-14 category b density,birads 1:neg Colonoscopy:  10/14 repeat 29yrs BMD:   2014 TDaP:  9/13 Labs: pcp does labs Self breast exam: not done   reports that she has never smoked. She has never used smokeless tobacco. She reports that she does not drink alcohol or use illicit drugs.  Past Medical History  Diagnosis Date  . Hypothyroidism   . Bladder stone   . S/P ablation operation for arrhythmia     [place]  . History of syncope     S/P CARDIAC ABLATION OF ARRYTHMIA  . Arthritis   . Personal history of colonic adenomas 04/11/2013    Past Surgical History  Procedure Laterality Date  . Laparoscopic assisted vaginal hysterectomy  1999    W/ RIGHT SALPINGOOPHORECTOMY AND BLADDER TACK SUSPENSION  . Cardiac electrophysiology study and ablation  1998 (APPROX)  DR Caryl Comes    ARRHYTHMIA (PT UNSURE TYPE BUT HAD SYNCOPAL EPISODES)  PER PT NO S &S SINCE  . Cystoscopy with biopsy N/A 02/21/2013    Procedure: CYSTOSCOPY WITH BIOPSY  Transurethral laser of bladder stone and foreign body;  Surgeon: Reece Packer, MD;  Location: [facility];  Service: Urology;  Laterality: N/A;  60 mins req for this case   . Holmium laser application N/A  [id number]    Procedure: HOLMIUM LASER APPLICATION;  Surgeon: Reece Packer, MD;  Location: [facility];  Service: Urology;  Laterality: N/A;  . Cardiac catheterization  2002    with ablation  . Colonoscopy w/ polypectomy  04/07/2013    Current Outpatient Prescriptions  Medication Sig Dispense Refill  . aspirin 81 MG tablet Take 162 mg by mouth daily.    . calcium carbonate (OS-CAL) 600 MG TABS tablet Take 600 mg by mouth daily. Chewable = vitamin d    . estradiol (MINIVELLE) 0.0375 MG/24HR Apply one patch weekly 8 patch 1  . fish oil-omega-3 fatty acids 1000 MG capsule Take 1 g by mouth daily. Krill oil    . levothyroxine (SYNTHROID, LEVOTHROID) 175 MCG tablet Take 175 mcg by mouth daily before breakfast. 6 days weekly    . meloxicam (MOBIC) 15 MG tablet Take 1 tablet (15 mg total) by mouth daily. 30 tablet 1  . metroNIDAZOLE (METROGEL) 0.75 % gel Apply 1 application topically daily. For rosacea    . mometasone (NASONEX) 50 MCG/ACT nasal spray Place 2 sprays into the nose daily as needed (for allergies). As needed    . Multiple Vitamin (MULTI-VITAMIN DAILY PO) Take 1 tablet by mouth daily.     Marland Kitchen omeprazole (PRILOSEC) 20 MG capsule Take 20 mg by mouth daily.    Marland Kitchen PENNSAID 2 % SOLN   0  . Psyllium (METAMUCIL PO) Take  by mouth. 1tsp 3 times daily    . Tretinoin (RETIN-A EX) Apply topically. cream    . UNABLE TO FIND daily. instaflex advance    . zolpidem (AMBIEN) 10 MG tablet Take 5 mg by mouth at bedtime as needed for sleep.     No current facility-administered medications for this visit.    Family History  Problem Relation Age of Onset  . Other Mother     caricosities  . Hypertension Father   . Other Sister     caricosities  . Colon cancer Neg Hx   . Diabetes Neg Hx   . Heart disease Neg Hx   . Kidney disease Neg Hx   . Liver disease Neg Hx   . Prostate cancer Father     ROS:  Pertinent items are noted in HPI.  Otherwise, a comprehensive ROS was negative.  Exam:   BP  120/80 mmHg  Pulse 68  Resp 16  Ht 5\' 8"  (1.727 m)  Wt 161 lb (73.029 kg)  BMI 24.49 kg/m2  LMP 07/06/1997 Height: 5\' 8"  (172.7 cm) Ht Readings from Last 3 Encounters:  05/23/15 5\' 8"  (1.727 m)  06/19/14 5' 8.25" (1.734 m)  05/15/14 5' 8.25" (1.734 m)    General appearance: alert, cooperative and appears stated age Head: Normocephalic, without obvious abnormality, atraumatic Neck: no adenopathy, supple, symmetrical, trachea midline and thyroid normal to inspection and palpation Lungs: clear to auscultation bilaterally Breasts: normal appearance, no masses or tenderness, No nipple retraction or dimpling, No nipple discharge or bleeding, No axillary or supraclavicular adenopathy Heart: regular rate and rhythm Abdomen: soft, non-tender; no masses,  no organomegaly Extremities: extremities normal, atraumatic, no cyanosis or edema Skin: Skin color, texture, turgor normal. No rashes or lesions Lymph nodes: Cervical, supraclavicular, and axillary nodes normal. No abnormal inguinal nodes palpated Neurologic: Grossly normal   Pelvic: External genitalia:  no lesions              Urethra:  normal appearing urethra with no masses, tenderness or lesions              Bartholin's and Skene's: normal                 Vagina: normal appearing vagina with normal color and discharge, no lesions, grade 3 cystocele noted, small rectocele noted(not symptomatic)              Cervix: absent              Pap taken: No. Bimanual Exam:  Uterus:  uterus absent              Adnexa: no mass, fullness, tenderness and adnexal absent on right, left not palpated               Rectovaginal: Confirms               Anus:  normal sphincter tone, no lesions  Chaperone present: yes  A:  Well Woman with normal exam  Menopausal on ERT S/P LAVH with RSO for prolapse and bleeding  Cystocele symptomatic with mild rectocele  Hypothyroid with PCP management   P:   Reviewed health and wellness pertinent to  exam  Discussed risks and benefits and [facility] information on ERT use and risk of cardiovascular changes. Patient feels so much better on, discussed will renew Rx  and plan to wean off next year. Patient to advise if any health changes and will need to stop ERT.  Rx Vivelle Dot  see order   Discussed cystocele/rectocele finding and treat options of pessary trial for support, consult for possible repair again, or continue to work with kegels. Patient given information on pessary and she could manage herself. Patient will consider and advise if she wishes to proceed with anything or if changes.Questions addressed  Continue PCP follow up as indicated.  Pap smear as above not taken    counseled on breast self exam, mammography screening, adequate intake of calcium and vitamin D, diet and exercise  return annually or prn  An After Visit Summary was printed and given to the patient.

## 2015-05-23 NOTE — Patient Instructions (Signed)

## 2015-05-26 NOTE — Progress Notes (Signed)
Reviewed personally.  M. Suzanne Brie Eppard, MD.  

## 2015-06-07 ENCOUNTER — Ambulatory Visit: Payer: Self-pay | Admitting: Certified Nurse Midwife

## 2015-07-03 ENCOUNTER — Telehealth: Payer: Self-pay | Admitting: Certified Nurse Midwife

## 2015-07-03 NOTE — Telephone Encounter (Signed)
She was to finish the current patch she had then switch to the 0.025mg  and use this to wean off with.

## 2015-07-03 NOTE — Telephone Encounter (Addendum)
Melvia Heaps CNM, patient was seen in the office on 05/23/2015 for aex. Patient was previously using Estradiol 0.0375 mg patches once per week. Per note refill was given and patient is to wean off HRT over the next year. Rx was written for Estradiol 0.025 mg patches to place twice per week. Would you like the patient to use the 0.025 mg patches to help wean off or was she supposed to continue with the 0.0375 mg?

## 2015-07-03 NOTE — Telephone Encounter (Signed)
Patient has two prescription for the patch and is not sure what dosage she should be taking.

## 2015-07-03 NOTE — Telephone Encounter (Signed)
Spoke with patient. Advised of message as seen below from [place]. Patient is agreeable. She has run out of her 0.0375 mg dosage and will start the 0.025 mg patches at this time.  Routing to provider for final review. Patient agreeable to disposition. Will close encounter.

## 2015-07-03 NOTE — Telephone Encounter (Signed)
Was given the lower dose to wean off with. She will need to use this dosage.

## 2015-08-23 ENCOUNTER — Telehealth: Payer: Self-pay | Admitting: Certified Nurse Midwife

## 2015-08-23 NOTE — Telephone Encounter (Signed)
Refused refill due to the pt having refills available. Original Rx was written on 05/23/15 for 12 refills. MPF

## 2015-09-11 ENCOUNTER — Other Ambulatory Visit: Payer: Self-pay | Admitting: Certified Nurse Midwife

## 2015-09-13 NOTE — Telephone Encounter (Signed)
Spoke with patient about the refusal. She is confuse as to if she should be taking the 0.025mg  patch once weekly or twice weekly. She has continued to use the 0.0375 patch once weekly due to her believing that because she was only doing one patch a week, she was weaning herself off. Please clarify the dosage and frequency of patch so refill can be made. I have advised the patient that I will call and clarify to her the directions before the refill is made. Pt agreeable to plan.

## 2015-09-13 NOTE — Telephone Encounter (Signed)
Patient wants to speak with the nurse concerning her denied refill request.

## 2015-09-13 NOTE — Telephone Encounter (Signed)
She needs to now be using the lower dosage and only once weekly now to continue with weaning off.

## 2015-09-17 NOTE — Telephone Encounter (Signed)
agree

## 2015-09-17 NOTE — Telephone Encounter (Signed)
Patient states there is still some confusion regarding her patch and she wants to speak with the nurse.

## 2015-09-17 NOTE — Telephone Encounter (Signed)
Spoke with patient about her patches, I advised her again that she should be on the 0.025mg  patch once a week to wean herself off. Pt did mention that she had a bone density scan at the end of 2016 and they confirmed that she does have osteoporosis. Pt was concerned about how her patches would affect that. I explained that she should remain off the 0.0375mg  patch and take her lower dose until the provider fully discontinues it. Pt states that she will be calling Dr. Buel Ream office to request that a copy of her scan be sent to our office. All of the patient's questions were answered and she was agreeable with the plan.

## 2015-12-05 ENCOUNTER — Telehealth: Payer: Self-pay | Admitting: Certified Nurse Midwife

## 2015-12-05 NOTE — Telephone Encounter (Signed)
Patient wants to discuss bladder relaxation.

## 2015-12-06 NOTE — Telephone Encounter (Signed)
Spoke with patient. Patient state that when she was last seen with Melvia Heaps CNM in 05/2015 they discussed patient returning for a pessary fitting. Patient states that she would like to move forward with pessary fitting at this time. Appointment scheduled for 12/13/2015 at 2 pm with Melvia Heaps CNM. She is agreeable to date and time.  Routing to provider for final review. Patient agreeable to disposition. Will close encounter.

## 2015-12-13 ENCOUNTER — Ambulatory Visit (INDEPENDENT_AMBULATORY_CARE_PROVIDER_SITE_OTHER): Payer: 59 | Admitting: Certified Nurse Midwife

## 2015-12-13 ENCOUNTER — Encounter: Payer: Self-pay | Admitting: Certified Nurse Midwife

## 2015-12-13 ENCOUNTER — Other Ambulatory Visit: Payer: Self-pay | Admitting: Obstetrics & Gynecology

## 2015-12-13 VITALS — BP 110/68 | HR 72 | Resp 16 | Ht 68.0 in | Wt 167.0 lb

## 2015-12-13 DIAGNOSIS — N8110 Cystocele, unspecified: Secondary | ICD-10-CM | POA: Diagnosis not present

## 2015-12-13 DIAGNOSIS — N393 Stress incontinence (female) (male): Secondary | ICD-10-CM | POA: Diagnosis not present

## 2015-12-13 DIAGNOSIS — N39490 Overflow incontinence: Secondary | ICD-10-CM

## 2015-12-13 DIAGNOSIS — Z1231 Encounter for screening mammogram for malignant neoplasm of breast: Secondary | ICD-10-CM

## 2015-12-13 DIAGNOSIS — IMO0002 Reserved for concepts with insufficient information to code with codable children: Secondary | ICD-10-CM

## 2015-12-13 NOTE — Patient Instructions (Signed)

## 2015-12-13 NOTE — Progress Notes (Signed)
Reviewed personally.  M. Suzanne Myrene Bougher, MD.  

## 2015-12-13 NOTE — Progress Notes (Signed)
60 y.o. MarriedWhite female   [phone] here for pessary fitting.  Patient has been diagnosed with the following indications warranting pessary use: Cystocele- symptomatic with incomplete emptying and stress incontinence Patient had a" bladder tack repair" in 1999 with rectocele repair. Has noted the bulge from her vagina and was also noted at recent aex. Would like to see if pessary at least possible for support. Urine taken today for culture.  She reports these associated symptoms: Vaginal discharge: no Vaginal pressure:  yes Urinary symptoms:   urinary retention and urinary incontinence.   Other:  Has to change position to complete emptying of bladder.  She has been counseled about other options including pelvic physical therapy(which she has tried) surgical intervention, as well as doing nothing.  She has decided to proceed with pessary use and is here for fitting today.    Patient is sexually active. Urine culture has not been performed. Collected today  Exam: BP 110/68 mmHg  Pulse 72  Resp 16  Ht 5\' 8"  (1.727 m)  Wt 167 lb (75.751 kg)  BMI 25.40 kg/m2  LMP 07/06/1997 General appearance: alert, cooperative, appears stated age and no distress No enlarged inguinal lymph nodes   Pelvic: External genitalia:  no lesions, normal female              Urethra: normal appearing urethra with no masses, tenderness or lesions              Bartholins and Skenes: Bartholin's, Urethra, Skene's normal                 Vagina: normal appearing vagina with normal color and discharge, no lesions, with grade 2-3 cystocele              Cervix: absent Bimanual Exam:  Uterus:  uterus is normal size, shape, consistency and nontender, surgically absent, vaginal cuff well healed                               Adnexa:    Normal,non tender, no fullness or masses                               Anus:  Normal appearance  Procedure:  Patient fitted with the following pessary and sizes:  Ring pessary with support  size 2 and 3.  After each fitting, patient was advised to redress, ambulate,  Due to the limited depth of vagina a pessary will not remain the vagina without discomfort and provides no support. No other pessary types were tried due to this reason. Pessary was removed without difficulty. Discussed patient that she is not a candidate for pessary use due to vaginal dynamics. Discussed consultation with Dr. Quincy Simmonds for urodynamic testing for urine residual and exam with recommendations of possible treatment. Patient agreeable. Questions addressed at length. Lab Urine cultue  Assessment:   Normal pelvic exam Cystocele unable to fit pessary due to previous vaginal surgery and bladder suspension  Stress incontinence with retention   Plan: Patient will be called with urine culture results and will advised of next step after discussing with Dr. Quincy Simmonds. Patient agreeable.  Aftercare summary was provided.

## 2015-12-15 LAB — URINE CULTURE
COLONY COUNT: NO GROWTH
ORGANISM ID, BACTERIA: NO GROWTH

## 2015-12-16 NOTE — Progress Notes (Signed)
Please set up an appointment for me to evaluate the patient.  I would like to do this prior to ordering any urodynamic testing for her.  I see the her urine culture was negative.

## 2015-12-20 ENCOUNTER — Ambulatory Visit
Admission: RE | Admit: 2015-12-20 | Discharge: 2015-12-20 | Disposition: A | Discharge status: Home / Self Care | Payer: Self-pay | Source: Ambulatory Visit | Attending: Obstetrics & Gynecology | Admitting: Obstetrics & Gynecology

## 2015-12-20 DIAGNOSIS — Z1231 Encounter for screening mammogram for malignant neoplasm of breast: Secondary | ICD-10-CM

## 2015-12-31 ENCOUNTER — Telehealth: Payer: Self-pay | Admitting: Certified Nurse Midwife

## 2015-12-31 NOTE — Telephone Encounter (Signed)
Nunzio Cobbs, MD at 12/16/2015 7:41 PM     Status: Signed       Expand All Collapse All   Please set up an appointment for me to evaluate the patient.  I would like to do this prior to ordering any urodynamic testing for her.  I see the her urine culture was negative.       Spoke with patient. Advised patient will need to be seen for evaluation with Dr.Silva prior to scheduling urodynamic testing. She is agreeable. Appointment scheduled for 01/03/2016 at 2:30 pm with Dr.Silva. She is agreeable to date and time.  Routing to provider for final review. Patient agreeable to disposition. Will close encounter.

## 2015-12-31 NOTE — Telephone Encounter (Signed)
Patient wants to schedule an appointment with Dr. Quincy Simmonds. She thinks it may be for urodynamic testing.

## 2016-01-03 ENCOUNTER — Ambulatory Visit (INDEPENDENT_AMBULATORY_CARE_PROVIDER_SITE_OTHER): Payer: 59 | Admitting: Obstetrics and Gynecology

## 2016-01-03 ENCOUNTER — Encounter: Payer: Self-pay | Admitting: Obstetrics and Gynecology

## 2016-01-03 VITALS — BP 102/82 | HR 80 | Ht 68.0 in | Wt 168.4 lb

## 2016-01-03 DIAGNOSIS — N816 Rectocele: Secondary | ICD-10-CM | POA: Diagnosis not present

## 2016-01-03 DIAGNOSIS — N8110 Cystocele, unspecified: Secondary | ICD-10-CM

## 2016-01-03 DIAGNOSIS — IMO0002 Reserved for concepts with insufficient information to code with codable children: Secondary | ICD-10-CM

## 2016-01-03 DIAGNOSIS — N993 Prolapse of vaginal vault after hysterectomy: Secondary | ICD-10-CM | POA: Diagnosis not present

## 2016-01-03 NOTE — Progress Notes (Signed)
Patient ID: Natalie Schultz, female   DOB: September 03, 1955, 60 y.o.   MRN: [id number] GYNECOLOGY  VISIT   HPI: 60 y.o.   Married  Caucasian  female   [phone] with Patient's last menstrual period was 07/06/1997.   here for stress urinary incontinence and incomplete bladder emptying.  She saw one of our providers and fitted for a pessary 6-0-17 for pessary would not stay in. UC negative 12/13/15.   Status post TAH/RSO/bladder tack with Dr. Ree Edman.   In the last year can feel a bulge and is having changes during intercourse. (Husband noted this.)  Voiding often to avoid urgency. Feels she is not voiding well.  No stress incontinence ever.   Hemorrhoid issue last week.  Felt pressure symptoms.  Uses Metamucil and colace.  Used some Tucks pads due to discomfort.  Hx of fourth degree laceration extension of episiotomy with first delivery. Baby was about 10 pounds.  All 4 deliveries were vaginal.  Hx cardiac arrhythmia. Status post cardiac ablation.  No prior MI or stroke.  Works out often.   Feels bloating.  Normal MRI of abdomen and pelvic in 2014.   GYNECOLOGIC HISTORY: Patient's last menstrual period was 07/06/1997. Contraception:  Hysterectomy/RSO/bladder suspension--1999 Menopausal hormone therapy:  none Last mammogram:  12-20-15 Density C/Neg/BiRads1:[facility] Last pap smear:   02/2010 Neg        OB History    Gravida Para Term Preterm AB TAB SAB Ectopic Multiple Living   6 4   2 2    4          Patient Active Problem List   Diagnosis Date Noted  . Chronic RLQ pain 07/11/2013  . Diverticulosis of colon without hemorrhage 06/15/2013  . S/P hysterectomy 06/15/2013  . Personal history of colonic adenomas 04/11/2013    Past Medical History  Diagnosis Date  . Hypothyroidism   . Bladder stone   . S/P ablation operation for arrhythmia     [place]  . History of syncope     S/P CARDIAC ABLATION OF ARRYTHMIA  . Arthritis   . Personal  history of colonic adenomas 04/11/2013    Past Surgical History  Procedure Laterality Date  . Laparoscopic assisted vaginal hysterectomy  1999    W/ RIGHT SALPINGOOPHORECTOMY AND BLADDER TACK SUSPENSION  . Cardiac electrophysiology study and ablation  1998 (APPROX)  DR Caryl Comes    ARRHYTHMIA (PT UNSURE TYPE BUT HAD SYNCOPAL EPISODES)  PER PT NO S &S SINCE  . Cystoscopy with biopsy N/A 02/21/2013    Procedure: CYSTOSCOPY WITH BIOPSY  Transurethral laser of bladder stone and foreign body;  Surgeon: Reece Packer, MD;  Location: [facility];  Service: Urology;  Laterality: N/A;  60 mins req for this case   . Holmium laser application N/A [id number]    Procedure: HOLMIUM LASER APPLICATION;  Surgeon: Reece Packer, MD;  Location: [facility];  Service: Urology;  Laterality: N/A;  . Cardiac catheterization  2002    with ablation  . Colonoscopy w/ polypectomy  04/07/2013    Current Outpatient Prescriptions  Medication Sig Dispense Refill  . aspirin 81 MG tablet Take 162 mg by mouth daily.    . calcium carbonate (OS-CAL) 600 MG TABS tablet Take 600 mg by mouth daily. Chewable = vitamin d    . fluticasone (FLONASE) 50 MCG/ACT nasal spray   0  . levothyroxine (SYNTHROID, LEVOTHROID) 175 MCG tablet Take 175 mcg by mouth daily before breakfast. 6 days  weekly    . meloxicam (MOBIC) 15 MG tablet Take 1 tablet (15 mg total) by mouth daily. 30 tablet 1  . metroNIDAZOLE (METROGEL) 0.75 % gel Apply 1 application topically daily. For rosacea    . Multiple Vitamin (MULTI-VITAMIN DAILY PO) Take 1 tablet by mouth daily.     Marland Kitchen omeprazole (PRILOSEC) 20 MG capsule Take 20 mg by mouth daily.    . Psyllium (METAMUCIL PO) Take by mouth. 1tsp 3 times daily    . Tretinoin (RETIN-A EX) Apply topically. cream    . UNABLE TO FIND daily. instaflex advance    . zolpidem (AMBIEN) 10 MG tablet Take 5 mg by mouth at bedtime as needed for sleep. Reported on 12/13/2015     No current facility-administered medications for this  visit.     ALLERGIES: Amoxicillin; Erythromycin; and Biaxin  Family History  Problem Relation Age of Onset  . Other Mother     caricosities  . Hypertension Father   . Other Sister     caricosities  . Colon cancer Neg Hx   . Diabetes Neg Hx   . Heart disease Neg Hx   . Kidney disease Neg Hx   . Liver disease Neg Hx   . Prostate cancer Father     Social History   Social History  . Marital Status: Married    Spouse Name: N/A  . Number of Children: N/A  . Years of Education: N/A   Occupational History  . Not on file.   Social History Main Topics  . Smoking status: Never Smoker   . Smokeless tobacco: Never Used  . Alcohol Use: No  . Drug Use: No  . Sexual Activity:    Partners: Male    Birth Control/ Protection: Surgical     Comment: hysterectomy/RSO/bladder suspension 1999   Other Topics Concern  . Not on file   Social History Narrative    ROS:  Pertinent items are noted in HPI.  PHYSICAL EXAMINATION:    BP 102/82 mmHg  Pulse 80  Ht 5\' 8"  (1.727 m)  Wt 168 lb 6.4 oz (76.386 kg)  BMI 25.61 kg/m2  LMP 07/06/1997    General appearance: alert, cooperative and appears stated age   Abdomen: incision(s):Yes.  , ___Pfannenstiel_________  soft, non-tender, no masses,  no organomegaly Extremities: extremities normal, atraumatic, no cyanosis or edema No abnormal inguinal nodes palpated Neurologic: Grossly normal  Pelvic: External genitalia:  no lesions              Urethra:  normal appearing urethra with no masses, tenderness or lesions              Bartholins and Skenes: normal                 Vagina: normal appearing vagina with normal color and discharge, no lesions.  Third degree cystocele.  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 First degree rectocele.               Cervix: absent              Bimanual Exam:  Uterus:  uterus absent              Adnexa: no mass, fullness, tenderness              Rectal exam: Yes.  .  Confirms.              Anus:   normal sphincter tone, no lesions  Chaperone was  present for exam.  ASSESSMENT  Recurrent prolapse.  Cystocele, rectocele and vault prolapase. Status post TAH?/RSO/MMK or Burch procedure? Voiding dysfunction secondary to prolapse.   PLAN  I have discussed options for care for patient which include observation or surgical care - vaginal or abdominal.  I would favor a sacrocolpopexy if she chooses surgery as she is already having recurrent prolapse.  She understands that I do the sacrocolpopexy currently through an abdominal incision. I do not think that a pessary will be successful due to the length of her vagina and the nature of her vault prolapse. I have given her reading materials from [facility] about prolapse and surgery for prolapse.  She will need urodynamic testing if she would like to proceed with surgery in the future.  I will need to see her OP report also from her hysterectomy and bladder surgery.   An After Visit Summary was printed and given to the patient.  __25____ minutes face to face time of which over 50% was spent in counseling.

## 2016-03-24 ENCOUNTER — Ambulatory Visit
Admission: RE | Admit: 2016-03-24 | Discharge: 2016-03-24 | Disposition: A | Discharge status: Home / Self Care | Payer: 59 | Source: Ambulatory Visit | Attending: Internal Medicine | Admitting: Internal Medicine

## 2016-03-24 ENCOUNTER — Other Ambulatory Visit: Payer: Self-pay | Admitting: Internal Medicine

## 2016-03-24 DIAGNOSIS — M542 Cervicalgia: Secondary | ICD-10-CM

## 2016-06-02 DIAGNOSIS — H8110 Benign paroxysmal vertigo, unspecified ear: Secondary | ICD-10-CM | POA: Insufficient documentation

## 2016-06-03 ENCOUNTER — Ambulatory Visit: Payer: BLUE CROSS/BLUE SHIELD | Admitting: Certified Nurse Midwife

## 2016-07-06 DIAGNOSIS — G510 Bell's palsy: Secondary | ICD-10-CM

## 2016-07-06 HISTORY — DX: Bell's palsy: G51.0

## 2016-07-08 ENCOUNTER — Ambulatory Visit: Payer: 59 | Admitting: Certified Nurse Midwife

## 2016-07-13 ENCOUNTER — Emergency Department (HOSPITAL_COMMUNITY)
Admission: EM | Admit: 2016-07-13 | Discharge: 2016-07-13 | Disposition: A | Discharge status: Home / Self Care | Payer: Self-pay | Attending: Emergency Medicine | Admitting: Emergency Medicine

## 2016-07-13 ENCOUNTER — Encounter (HOSPITAL_COMMUNITY): Payer: Self-pay | Admitting: Emergency Medicine

## 2016-07-13 ENCOUNTER — Emergency Department (HOSPITAL_COMMUNITY): Payer: Self-pay

## 2016-07-13 DIAGNOSIS — G510 Bell's palsy: Secondary | ICD-10-CM

## 2016-07-13 DIAGNOSIS — R200 Anesthesia of skin: Secondary | ICD-10-CM

## 2016-07-13 DIAGNOSIS — E039 Hypothyroidism, unspecified: Secondary | ICD-10-CM | POA: Insufficient documentation

## 2016-07-13 DIAGNOSIS — R791 Abnormal coagulation profile: Secondary | ICD-10-CM | POA: Insufficient documentation

## 2016-07-13 DIAGNOSIS — R202 Paresthesia of skin: Secondary | ICD-10-CM | POA: Insufficient documentation

## 2016-07-13 DIAGNOSIS — Z7982 Long term (current) use of aspirin: Secondary | ICD-10-CM | POA: Insufficient documentation

## 2016-07-13 LAB — COMPREHENSIVE METABOLIC PANEL
ALBUMIN: 4.2 g/dL (ref 3.5–5.0)
ALK PHOS: 83 U/L (ref 38–126)
ALT: 26 U/L (ref 14–54)
AST: 27 U/L (ref 15–41)
Anion gap: 8 (ref 5–15)
BUN: 16 mg/dL (ref 6–20)
CALCIUM: 10 mg/dL (ref 8.9–10.3)
CO2: 27 mmol/L (ref 22–32)
CREATININE: 0.66 mg/dL (ref 0.44–1.00)
Chloride: 104 mmol/L (ref 101–111)
GFR calc Af Amer: >60 mL/min (ref >60)
GFR calc non Af Amer: >60 mL/min (ref >60)
Glucose, Bld: 96 mg/dL (ref 65–99)
Potassium: 4 mmol/L (ref 3.5–5.1)
SODIUM: 139 mmol/L (ref 135–145)
Total Bilirubin: 0.3 mg/dL (ref 0.3–1.2)
Total Protein: 7.4 g/dL (ref 6.5–8.1)

## 2016-07-13 LAB — I-STAT CHEM 8, ED
BUN: 20 mg/dL (ref 6–20)
CALCIUM ION: 1.21 mmol/L (ref 1.15–1.40)
CHLORIDE: 104 mmol/L (ref 101–111)
Creatinine, Ser: 0.8 mg/dL (ref 0.44–1.00)
GLUCOSE: 95 mg/dL (ref 65–99)
HCT: 47 % — ABNORMAL HIGH (ref 36.0–46.0)
Hemoglobin: 16 g/dL — ABNORMAL HIGH (ref 12.0–15.0)
POTASSIUM: 4 mmol/L (ref 3.5–5.1)
SODIUM: 140 mmol/L (ref 135–145)
TCO2: 28 mmol/L (ref 0–100)

## 2016-07-13 LAB — CBC
HCT: 47.6 % — ABNORMAL HIGH (ref 36.0–46.0)
Hemoglobin: 16.2 g/dL — ABNORMAL HIGH (ref 12.0–15.0)
MCH: 30.1 pg (ref 26.0–34.0)
MCHC: 34 g/dL (ref 30.0–36.0)
MCV: 88.3 fL (ref 78.0–100.0)
PLATELETS: 153 10*3/uL (ref 150–400)
RBC: 5.39 MIL/uL — AB (ref 3.87–5.11)
RDW: 12.9 % (ref 11.5–15.5)
WBC: 5.1 10*3/uL (ref 4.0–10.5)

## 2016-07-13 LAB — DIFFERENTIAL
BASOS PCT: 0 %
Basophils Absolute: 0 10*3/uL (ref 0.0–0.1)
Eosinophils Absolute: 0 10*3/uL (ref 0.0–0.7)
Eosinophils Relative: 1 %
LYMPHS PCT: 27 %
Lymphs Abs: 1.4 10*3/uL (ref 0.7–4.0)
Monocytes Absolute: 0.3 10*3/uL (ref 0.1–1.0)
Monocytes Relative: 6 %
NEUTROS ABS: 3.4 10*3/uL (ref 1.7–7.7)
NEUTROS PCT: 66 %

## 2016-07-13 LAB — PROTIME-INR
INR: 0.99
PROTHROMBIN TIME: 13.1 s (ref 11.4–15.2)

## 2016-07-13 LAB — I-STAT TROPONIN, ED: Troponin i, poc: 0 ng/mL (ref 0.00–0.08)

## 2016-07-13 LAB — APTT: APTT: 37 s — AB (ref 24–36)

## 2016-07-13 MED ORDER — HYDRALAZINE HCL 20 MG/ML IJ SOLN
10.0000 mg | INTRAMUSCULAR | Status: AC
  Administered 2016-07-13: 10 mg via INTRAVENOUS
  Filled 2016-07-13: qty 1

## 2016-07-13 MED ORDER — VALACYCLOVIR HCL 500 MG PO TABS
1000.0000 mg | ORAL_TABLET | Freq: Once | ORAL | Status: AC
  Administered 2016-07-13: 1000 mg via ORAL
  Filled 2016-07-13: qty 2

## 2016-07-13 MED ORDER — VALACYCLOVIR HCL 1 G PO TABS: 1000.0000 mg | ORAL_TABLET | Freq: Every day | ORAL | 0 refills | Status: AC

## 2016-07-13 MED ORDER — PREDNISONE 20 MG PO TABS
60.0000 mg | ORAL_TABLET | ORAL | Status: AC
  Administered 2016-07-13: 60 mg via ORAL
  Filled 2016-07-13: qty 3

## 2016-07-13 MED ORDER — PREDNISONE 10 MG PO TABS: 60.0000 mg | ORAL_TABLET | Freq: Every day | ORAL | 0 refills | Status: AC

## 2016-07-13 NOTE — Consult Note (Signed)
Requesting Physician: Dr. Vanita Panda    Chief Complaint: right facial numbness  History obtained from:  Patient     HPI:                                                                                                                                         Natalie Schultz is an 61 y.o. female presenting to [facility] secondary to sudden onset of right facial numbness and tingling along with right arm and leg tingling. Patient states that she woke this morning and felt normal went to the gym, while at the gym she started to note that her right face was tingling this progressed to a right arm and leg tingling. Currently she is comfortable in the ED with a CT showing no acute intracranial hemorrhage or stroke. Her blood pressure is elevated at 177/102 however patient does admit that her blood pressure is usually normal at 115/90 but she is very anxious at this moment.  Date last known well: Date: 07/13/2016 Time last known well: Time: 07:00 tPA Given: No: NIHSS1   Past Medical History:  Diagnosis Date  . Arthritis   . Bladder stone   . History of syncope    S/P CARDIAC ABLATION OF ARRYTHMIA  . Hypothyroidism   . Personal history of colonic adenomas 04/11/2013  . S/P ablation operation for arrhythmia    1998 BY DR Caryl Comes--  NO ISSUES SINCE    Past Surgical History:  Procedure Laterality Date  . CARDIAC CATHETERIZATION  2002   with ablation  . CARDIAC ELECTROPHYSIOLOGY STUDY AND ABLATION  1998 (APPROX)  DR Caryl Comes   ARRHYTHMIA (PT UNSURE TYPE BUT HAD SYNCOPAL EPISODES)  PER PT NO S &S SINCE  . COLONOSCOPY W/ POLYPECTOMY  04/07/2013  . CYSTOSCOPY WITH BIOPSY N/A 02/21/2013   Procedure: CYSTOSCOPY WITH BIOPSY  Transurethral laser of bladder stone and foreign body;  Surgeon: Reece Packer, MD;  Location: [facility];  Service: Urology;  Laterality: N/A;  60 mins req for this case   . HOLMIUM LASER APPLICATION N/A [id number]   Procedure: HOLMIUM LASER APPLICATION;  Surgeon: Reece Packer, MD;   Location: [facility];  Service: Urology;  Laterality: N/A;  . LAPAROSCOPIC ASSISTED VAGINAL HYSTERECTOMY  1999   W/ RIGHT SALPINGOOPHORECTOMY AND BLADDER TACK SUSPENSION    Family History  Problem Relation Age of Onset  . Other Mother     caricosities  . Hypertension Father   . Prostate cancer Father   . Other Sister     caricosities  . Colon cancer Neg Hx   . Diabetes Neg Hx   . Heart disease Neg Hx   . Kidney disease Neg Hx   . Liver disease Neg Hx    Social History:  reports that she has never smoked. She has never used smokeless tobacco. She reports that she does not drink alcohol or use drugs.  Allergies:  Allergies  Allergen Reactions  . Amoxicillin Hives and Shortness Of Breath  . Erythromycin Hives and Shortness Of Breath  . Biaxin [Clarithromycin] Hives    Medications:                                                                                                                           No current facility-administered medications for this encounter.    Current Outpatient Prescriptions  Medication Sig Dispense Refill  . aspirin 81 MG tablet Take 162 mg by mouth daily.    . calcium carbonate (OS-CAL) 600 MG TABS tablet Take 600 mg by mouth daily. Chewable = vitamin d    . fluticasone (FLONASE) 50 MCG/ACT nasal spray   0  . levothyroxine (SYNTHROID, LEVOTHROID) 175 MCG tablet Take 175 mcg by mouth daily before breakfast. 6 days weekly    . meloxicam (MOBIC) 15 MG tablet Take 1 tablet (15 mg total) by mouth daily. 30 tablet 1  . metroNIDAZOLE (METROGEL) 0.75 % gel Apply 1 application topically daily. For rosacea    . Multiple Vitamin (MULTI-VITAMIN DAILY PO) Take 1 tablet by mouth daily.     Marland Kitchen omeprazole (PRILOSEC) 20 MG capsule Take 20 mg by mouth daily.    . Psyllium (METAMUCIL PO) Take by mouth. 1tsp 3 times daily    . Tretinoin (RETIN-A EX) Apply topically. cream    . UNABLE TO FIND daily. instaflex advance    . zolpidem (AMBIEN) 10 MG tablet Take 5 mg by mouth  at bedtime as needed for sleep. Reported on 12/13/2015       ROS:                                                                                                                                       History obtained from the patient  General ROS: negative for - chills, fatigue, fever, night sweats, weight gain or weight loss Psychological ROS: negative for - behavioral disorder, hallucinations, memory difficulties, mood swings or suicidal ideation Ophthalmic ROS: negative for - blurry vision, double vision, eye pain or loss of vision ENT ROS: negative for - epistaxis, nasal discharge, oral lesions, sore throat, tinnitus or vertigo Allergy and Immunology ROS: negative for - hives or itchy/watery eyes Hematological and Lymphatic ROS: negative for - bleeding problems, bruising or swollen lymph nodes Endocrine ROS: negative for - galactorrhea, hair pattern changes, polydipsia/polyuria  or temperature intolerance Respiratory ROS: negative for - cough, hemoptysis, shortness of breath or wheezing Cardiovascular ROS: negative for - chest pain, dyspnea on exertion, edema or irregular heartbeat Gastrointestinal ROS: negative for - abdominal pain, diarrhea, hematemesis, nausea/vomiting or stool incontinence Genito-Urinary ROS: negative for - dysuria, hematuria, incontinence or urinary frequency/urgency Musculoskeletal ROS: negative for - joint swelling or muscular weakness Neurological ROS: as noted in HPI Dermatological ROS: negative for rash and skin lesion changes  Neurologic Examination:                                                                                                      Blood pressure (!) 177/122, pulse 89, temperature 98.1 F (36.7 C), temperature source Oral, resp. rate 20, last menstrual period 07/06/1997, SpO2 91 %.  HEENT-  Normocephalic, no lesions, without obvious abnormality.  Normal external eye and conjunctiva.  Normal TM's bilaterally.  Normal auditory canals and external  ears. Normal external nose, mucus membranes and septum.  Normal pharynx. Cardiovascular- S1, S2 normal, pulses palpable throughout   Lungs- chest clear, no wheezing, rales, normal symmetric air entry Abdomen- normal findings: bowel sounds normal Extremities- no edema Lymph-no adenopathy palpable Musculoskeletal-no joint tenderness, deformity or swelling Skin-warm and dry, no hyperpigmentation, vitiligo, or suspicious lesions  Neurological Examination Mental Status: Alert, oriented, thought content appropriate.  Speech fluent without evidence of aphasia.  Able to follow 3 step commands without difficulty. Cranial Nerves: II: Discs flat bilaterally; Visual fields grossly normal,  III,IV, VI: ptosis not present, extra-ocular motions intact bilaterally, pupils equal, round, reactive to light and accommodation--orbicularis or strength with delayed closure on the right eye is weaker than the left and there does appear to be a lid lag when closing both eyes at the same time V,VII: smile symmetric, facial light touch sensation is decreased on the right side VIII: hearing normal bilaterally IX,X: uvula rises symmetrically XI: bilateral shoulder shrug XII: midline tongue extension Motor: Right : Upper extremity   5/5    Left:     Upper extremity   5/5  Lower extremity   5/5     Lower extremity   5/5 Tone and bulk:normal tone throughout; no atrophy noted Sensory: Pinprick and light touch intact throughout, bilaterally Deep Tendon Reflexes: 2+ and symmetric throughout Plantars: Right: downgoing   Left: downgoing Cerebellar: normal finger-to-nose, normal rapid alternating movements and normal heel-to-shin test Gait: Not tested       Lab Results: Basic Metabolic Panel:  Recent Labs Lab 07/13/16 1059  NA 140  K 4.0  CL 104  GLUCOSE 95  BUN 20  CREATININE 0.80    Liver Function Tests: No results for input(s): AST, ALT, ALKPHOS, BILITOT, PROT, ALBUMIN in the last 168 hours. No  results for input(s): LIPASE, AMYLASE in the last 168 hours. No results for input(s): AMMONIA in the last 168 hours.  CBC:  Recent Labs Lab 07/13/16 1050 07/13/16 1059  WBC 5.1  --   NEUTROABS 3.4  --   HGB 16.2* 16.0*  HCT 47.6* 47.0*  MCV 88.3  --   PLT 153  --  Cardiac Enzymes: No results for input(s): CKTOTAL, CKMB, CKMBINDEX, TROPONINI in the last 168 hours.  Lipid Panel: No results for input(s): CHOL, TRIG, HDL, CHOLHDL, VLDL, LDLCALC in the last 168 hours.  CBG: No results for input(s): GLUCAP in the last 168 hours.  Microbiology: Results for orders placed or performed in visit on 12/13/15  Urine Culture     Status: None   Collection Time: 12/13/15  3:22 PM  Result Value Ref Range Status   Colony Count NO GROWTH  Final   Organism ID, Bacteria NO GROWTH  Final    Coagulation Studies: No results for input(s): LABPROT, INR in the last 72 hours.  Imaging: Ct Head Code Stroke W/o Cm  Result Date: 07/13/2016 CLINICAL DATA:  Code stroke. RIGHT eye and face numbness and RIGHT arm and leg numbness began earlier today at 7 a.m. EXAM: CT HEAD WITHOUT CONTRAST TECHNIQUE: Contiguous axial images were obtained from the base of the skull through the vertex without intravenous contrast. COMPARISON:  MR brain 03/12/2005 FINDINGS: Brain: No evidence for acute infarction, hemorrhage, mass lesion, hydrocephalus, or extra-axial fluid. Mild to moderate atrophy, premature for age. Remote lacunar infarct, RIGHT inferior caudate, stable from 2006. Prominent LEFT occipital sulcus unchanged from 2006. Vascular: No signs of emergent large vessel occlusion. Vascular calcification affects the carotid siphon vessels and distal vertebral arteries. Skull: Normal. Negative for fracture or focal lesion. Sinuses/Orbits: No layering sinus fluid. Conjugate gaze to the LEFT, uncertain significance. Other: Compared with prior MR, the RIGHT caudate lacunar infarct is present. Mild atrophy has progressed.  ASPECTS [facility] Stroke Program Early CT Score) - Ganglionic level infarction (caudate, lentiform nuclei, internal capsule, insula, M1-M3 cortex): 7 - Supraganglionic infarction (M4-M6 cortex): 3 Total score (0-10 with 10 being normal): 10 IMPRESSION: 1. Atrophy and small vessel disease. No acute intracranial findings. 2. ASPECTS is 10. A call was placed to the stroke neurologist at 10:56 a.m. Electronically Signed   By: Staci Righter M.D.   On: 07/13/2016 11:02       Assessment and plan discussed with with attending physician and they are in agreement.    Etta Quill PA-C [facility] [phone]  07/13/2016, 11:09 AM   Assessment: 61 y.o. female presenting to the emergency room with sudden onset of right facial numbness and associated with right arm and leg numbness. It should be noted that she currently has a elevated blood pressure of 175/102 which is abnormal for her. Exam is most notable for what is likely to be a Bell's palsy however given her elevated blood pressure will obtain MRI to evaluate for possible stroke. If MRI is negative would recommend treating symptoms as Bell's palsy.  ( prednisone 60 mg daily for 7 days followed by a  Taper of 10 mg less daily until off along with Valacyclovir 1 Gram TID for 7 days. )  Stroke Risk Factors - none

## 2016-07-13 NOTE — ED Provider Notes (Signed)
[facility] DEPT Provider Note   CSN: [id number] Arrival date & time: 07/13/16  1006   An emergency department physician performed an initial assessment on this suspected stroke patient at 1040.  History   Chief Complaint Chief Complaint  Patient presents with  . Code Stroke    HPI Natalie Schultz is a 61 y.o. female.  HPI Patient presents with concern of new right facial tingling, difficulty closing her right eye, and right upper extremity numbness. Symptoms began about 3 hours prior to our evaluation, and the patient was well prior to this. She denies history of stroke, substantial medical problems, states that she has had no recent medication changes, diet changes, activity changes. Since onset symptoms of been persistent, no clear relieving or exacerbating factors. Patient notes ongoing anxiety about these symptoms. She is here with her husband who assists with the history of present illness. Patient arrives as a code stroke, had emergent head CT, neurology evaluation as well.   Past Medical History:  Diagnosis Date  . Arthritis   . Bladder stone   . History of syncope    S/P CARDIAC ABLATION OF ARRYTHMIA  . Hypothyroidism   . Personal history of colonic adenomas 04/11/2013  . S/P ablation operation for arrhythmia    [facility]    Patient Active Problem List   Diagnosis Date Noted  . Chronic RLQ pain 07/11/2013  . Diverticulosis of colon without hemorrhage 06/15/2013  . S/P hysterectomy 06/15/2013  . Personal history of colonic adenomas 04/11/2013    Past Surgical History:  Procedure Laterality Date  . CARDIAC CATHETERIZATION  2002   with ablation  . CARDIAC ELECTROPHYSIOLOGY STUDY AND ABLATION  1998 (APPROX)  DR Caryl Comes   ARRHYTHMIA (PT UNSURE TYPE BUT HAD SYNCOPAL EPISODES)  PER PT NO S &S SINCE  . COLONOSCOPY W/ POLYPECTOMY  04/07/2013  . CYSTOSCOPY WITH BIOPSY N/A 02/21/2013   Procedure: CYSTOSCOPY WITH BIOPSY  Transurethral  laser of bladder stone and foreign body;  Surgeon: Reece Packer, MD;  Location: [facility];  Service: Urology;  Laterality: N/A;  60 mins req for this case   . HOLMIUM LASER APPLICATION N/A [id number]   Procedure: HOLMIUM LASER APPLICATION;  Surgeon: Reece Packer, MD;  Location: [facility];  Service: Urology;  Laterality: N/A;  . LAPAROSCOPIC ASSISTED VAGINAL HYSTERECTOMY  1999   W/ RIGHT SALPINGOOPHORECTOMY AND BLADDER TACK SUSPENSION    OB History    Gravida Para Term Preterm AB Living   6 4     2 4    SAB TAB Ectopic Multiple Live Births     2             Home Medications    Prior to Admission medications   Medication Sig Start Date End Date Taking? Authorizing Provider  aspirin 81 MG tablet Take 162 mg by mouth daily.   Yes Historical Provider, MD  calcium carbonate (OS-CAL) 600 MG TABS tablet Take 600 mg by mouth daily. Chewable = vitamin d   Yes Historical Provider, MD  doxycycline (VIBRAMYCIN) 100 MG capsule Take 100 mg by mouth 2 (two) times daily. 07/10/16  Yes Historical Provider, MD  levothyroxine (SYNTHROID, LEVOTHROID) 175 MCG tablet Take 175 mcg by mouth daily before breakfast. 6 days weekly   Yes Historical Provider, MD  meloxicam (MOBIC) 15 MG tablet Take 1 tablet (15 mg total) by mouth daily. 07/11/13  Yes Gatha Mayer, MD  metroNIDAZOLE (METROGEL) 0.75 % gel Apply 1 application  topically daily. For rosacea   Yes Historical Provider, MD  metroNIDAZOLE (METROGEL) 0.75 % gel Apply 1 application topically daily. 03/23/16  Yes Historical Provider, MD  Multiple Vitamin (MULTI-VITAMIN DAILY PO) Take 1 tablet by mouth daily.    Yes Historical Provider, MD  omeprazole (PRILOSEC) 20 MG capsule Take 20 mg by mouth daily.   Yes Historical Provider, MD  Psyllium (METAMUCIL PO) Take by mouth. 1tsp 3 times daily   Yes Historical Provider, MD  Tretinoin (RETIN-A EX) Apply 1 application topically every evening. cream    Yes Historical Provider, MD  tretinoin (RETIN-A) 0.025 % cream  Apply 1 application topically every evening.   Yes Historical Provider, MD  UNABLE TO FIND daily. instaflex advance    Historical Provider, MD    Family History Family History  Problem Relation Age of Onset  . Other Mother     caricosities  . Hypertension Father   . Prostate cancer Father   . Other Sister     caricosities  . Colon cancer Neg Hx   . Diabetes Neg Hx   . Heart disease Neg Hx   . Kidney disease Neg Hx   . Liver disease Neg Hx     Social History Social History  Substance Use Topics  . Smoking status: Never Smoker  . Smokeless tobacco: Never Used  . Alcohol use No     Allergies   Amoxicillin; Erythromycin; and Biaxin [clarithromycin]   Review of Systems Review of Systems  Constitutional:       Per HPI, otherwise negative  HENT:       Per HPI, otherwise negative  Eyes:       Patient has difficulty closing the right eye  Respiratory:       Per HPI, otherwise negative  Cardiovascular:       Per HPI, otherwise negative  Gastrointestinal: Negative for vomiting.  Endocrine:       Negative aside from HPI  Genitourinary:       Neg aside from HPI   Musculoskeletal:       Per HPI, otherwise negative  Skin: Negative.   Neurological: Positive for weakness and numbness. Negative for syncope, speech difficulty and headaches.     Physical Exam Updated Vital Signs BP (!) 171/109 (BP Location: Right Arm)   Pulse 89   Temp 98.1 F (36.7 C) (Oral)   Resp 20   LMP 07/06/1997   SpO2 91%   Physical Exam  Constitutional: She is oriented to person, place, and time. She appears well-developed and well-nourished. No distress.  HENT:  Head: Normocephalic and atraumatic.  Eyes: Conjunctivae and EOM are normal.  No appreciable deficit on visual exam  Cardiovascular: Normal rate and regular rhythm.   Pulmonary/Chest: Effort normal and breath sounds normal. No stridor. No respiratory distress.  Abdominal: She exhibits no distension.  Musculoskeletal: She  exhibits no edema.  Neurological: She is alert and oriented to person, place, and time. No cranial nerve deficit.  Subjective neurologic loss of sensation right upper extremity, right face  Skin: Skin is warm and dry.  Psychiatric: She has a normal mood and affect.  Nursing note and vitals reviewed.    ED Treatments / Results  Labs (all labs ordered are listed, but only abnormal results are displayed) Labs Reviewed  APTT - Abnormal; Notable for the following:       Result Value   aPTT 37 (*)    All other components within normal limits  CBC - Abnormal; Notable  for the following:    RBC 5.39 (*)    Hemoglobin 16.2 (*)    HCT 47.6 (*)    All other components within normal limits  I-STAT CHEM 8, ED - Abnormal; Notable for the following:    Hemoglobin 16.0 (*)    HCT 47.0 (*)    All other components within normal limits  PROTIME-INR  DIFFERENTIAL  COMPREHENSIVE METABOLIC PANEL  I-STAT TROPOININ, ED  CBG MONITORING, ED    EKG  EKG Interpretation  Date/Time:  Monday July 13 2016 10:27:39 EST Ventricular Rate:  76 PR Interval:  144 QRS Duration: 78 QT Interval:  368 QTC Calculation: 414 R Axis:   -22 Text Interpretation:  Normal sinus rhythm T wave abnormality Abnormal ekg Confirmed by Carmin Muskrat  MD [phone]) on 07/13/2016 10:57:34 AM       Radiology Mr Brain Wo Contrast  Result Date: 07/13/2016 CLINICAL DATA:  61 year old female code stroke with right face numbness, right extremity numbness beginning at 0700 hours. Initial encounter. EXAM: MRI HEAD WITHOUT CONTRAST TECHNIQUE: Multiplanar, multiecho pulse sequences of the brain and surrounding structures were obtained without intravenous contrast. COMPARISON:  Head CT without contrast 1042 hours today. Brain MRI 03/12/2005 FINDINGS: Brain: No restricted diffusion to suggest acute infarction. No midline shift, mass effect, evidence of mass lesion, ventriculomegaly, extra-axial collection or acute intracranial  hemorrhage. Cervicomedullary junction and pituitary are within normal limits. Mild generalized cerebral volume loss since 2006. Pearline Cables and white matter signal has not significantly changed and remains within normal limits for age. No cortical encephalomalacia or chronic cerebral blood products. Vascular: Major intracranial vascular flow voids are stable since 2006. 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 Skull and upper cervical spine: Negative. Normal bone marrow signal. Sinuses/Orbits: Stable and negative. Other: Visible internal auditory structures appear normal. Mastoids remain clear. Negative scalp soft tissues. IMPRESSION: No acute intracranial abnormality. Negative for age noncontrast MRI appearance of the brain. Electronically Signed   By: Genevie Ann M.D.   On: 07/13/2016 12:44   Ct Head Code Stroke W/o Cm  Result Date: 07/13/2016 CLINICAL DATA:  Code stroke. RIGHT eye and face numbness and RIGHT arm and leg numbness began earlier today at 7 a.m. EXAM: CT HEAD WITHOUT CONTRAST TECHNIQUE: Contiguous axial images were obtained from the base of the skull through the vertex without intravenous contrast. COMPARISON:  MR brain 03/12/2005 FINDINGS: Brain: No evidence for acute infarction, hemorrhage, mass lesion, hydrocephalus, or extra-axial fluid. Mild to moderate atrophy, premature for age. Remote lacunar infarct, RIGHT inferior caudate, stable from 2006. Prominent LEFT occipital sulcus unchanged from 2006. Vascular: No signs of emergent large vessel occlusion. Vascular calcification affects the carotid siphon vessels and distal vertebral arteries. Skull: Normal. Negative for fracture or focal lesion. Sinuses/Orbits: No layering sinus fluid. Conjugate gaze to the LEFT, uncertain significance. Other: Compared with prior MR, the RIGHT caudate lacunar infarct is present. Mild atrophy has progressed. ASPECTS [facility] Stroke Program Early CT Score) - Ganglionic level infarction (caudate, lentiform  nuclei, internal capsule, insula, M1-M3 cortex): 7 - Supraganglionic infarction (M4-M6 cortex): 3 Total score (0-10 with 10 being normal): 10 IMPRESSION: 1. Atrophy and small vessel disease. No acute intracranial findings. 2. ASPECTS is 10. A call was placed to the stroke neurologist at 10:56 a.m. Electronically Signed   By: Staci Righter M.D.   On: 07/13/2016 11:02    Procedures Procedures (including critical care time)  Medications Ordered in ED Medications  hydrALAZINE (APRESOLINE) injection 10 mg (10 mg Intravenous Given 07/13/16  1403)  predniSONE (DELTASONE) tablet 60 mg (60 mg Oral Given 07/13/16 1402)  valACYclovir (VALTREX) tablet 1,000 mg (1,000 mg Oral Given 07/13/16 1402)     Initial Impression / Assessment and Plan / ED Course  I have reviewed the triage vital signs and the nursing notes.  Pertinent labs & imaging results that were available during my care of the patient were reviewed by me and considered in my medical decision making (see chart for details).  Clinical Course    On repeat exam the patient is in no distress, she is awake, alert, no new complaints per Now, following CT, MR, lab results, all findings discussed with her and her husband. Per neurology notes, and with her physical exam, there suspicion for Bell's palsy, no evidence for stroke. Patient started on appropriate medication, will follow-up with primary care and neurology.   Final Clinical Impressions(s) / ED Diagnoses   Final diagnoses:  Numbness    New Prescriptions Discharge Medication List as of 07/13/2016  2:33 PM    START taking these medications   Details  predniSONE (DELTASONE) 10 MG tablet Take 6 tablets (60 mg total) by mouth daily with breakfast. prednisone 60 mg daily for 6 days followed by a tapering of 10 mg less daily until finished. (50mg  on 1/15, 40mg  on 1/16,...), Starting Mon 07/13/2016, Until Sun 07/19/2016, Print    valACYclovir (VALTREX) 1000 MG tablet Take 1 tablet (1,000 mg total)  by mouth daily., Starting Mon 07/13/2016, Until Mon 07/20/2016, Print         Carmin Muskrat, MD 07/13/16 1501

## 2016-07-13 NOTE — Code Documentation (Signed)
61 y.o. Female arrives to [facility] ED as a code stroke. The patient woke up in her normal state of health this morning. She went to the gym per her usual routine and upon arrival, around 0715, started to have an acute onset of facial numbness. The facial numbness progressed to include her RUE and RLE. The numbness is reported to be more severe in the right hand and right foot. Labs drawn. CT shows no acute intracranial hemorrhage. NIHSS 2. See EMR for NIHSS and code times. Upon assessment at [facility] ED, pt has slight right sided facial droop and decrease sensation to her right face, RUE and RLE.  tPA not given d/t too mild to treat. Pt to stay in the tPA window until 1145. MRI to be obtained. Bedside handoff with ED RN Janett Billow.

## 2016-07-13 NOTE — ED Notes (Signed)
Returned from MRI 

## 2016-07-13 NOTE — Discharge Instructions (Signed)
As discussed today's evaluation has been largely reassuring.  Symptoms are likely due to Bell's palsy.  However, given the new nature of these symptoms it is important that you follow-up with your primary care physician and our neurologists.  Please take all medication as directed, and schedule follow-up visits.  Return here for concerning changes in her condition.

## 2016-07-13 NOTE — ED Triage Notes (Signed)
Rt sided numbness and tingling, blurred vision in rt eye that started  This am at 730 am , nothing has resolved

## 2016-07-13 NOTE — ED Notes (Signed)
Placed patient into a gown and on the monitor patient is resting

## 2016-07-13 NOTE — ED Notes (Signed)
Discharge instructions covered at length. Pt verbalized understanding of prescription meds and follow up appt. No neuro deficits noted

## 2018-02-10 DIAGNOSIS — M25561 Pain in right knee: Secondary | ICD-10-CM | POA: Insufficient documentation

## 2018-02-10 DIAGNOSIS — M25562 Pain in left knee: Secondary | ICD-10-CM

## 2018-02-25 ENCOUNTER — Encounter: Payer: Self-pay | Admitting: Certified Nurse Midwife

## 2018-02-25 ENCOUNTER — Other Ambulatory Visit: Payer: Self-pay

## 2018-02-25 ENCOUNTER — Ambulatory Visit (INDEPENDENT_AMBULATORY_CARE_PROVIDER_SITE_OTHER): Payer: Self-pay | Admitting: Certified Nurse Midwife

## 2018-02-25 VITALS — BP 110/64 | HR 68 | Resp 16 | Ht 67.75 in | Wt 161.0 lb

## 2018-02-25 DIAGNOSIS — Z0000 Encounter for general adult medical examination without abnormal findings: Secondary | ICD-10-CM

## 2018-02-25 DIAGNOSIS — Z01419 Encounter for gynecological examination (general) (routine) without abnormal findings: Secondary | ICD-10-CM

## 2018-02-25 LAB — POCT URINALYSIS DIPSTICK
Bilirubin, UA: NEGATIVE
Blood, UA: NEGATIVE
Glucose, UA: NEGATIVE
Ketones, UA: NEGATIVE
LEUKOCYTES UA: NEGATIVE
NITRITE UA: NEGATIVE
PROTEIN UA: NEGATIVE
Urobilinogen, UA: NEGATIVE E.U./dL — AB
pH, UA: 5 (ref 5.0–8.0)

## 2018-02-25 NOTE — Progress Notes (Signed)
62 y.o. [id number] Married  Caucasian Fe here for annual exam. Post Menopausal no vaginal bleeding or vaginal dryness issues. Still notes vaginal wall and cystocele prolapse, but no urinary issues at present. Had fall this year and was seen, no issues, just soreness. Sees PCP yearly due 10/19 for Hypertension, hypothyroid, GERD, Vitamin D, labs/aex. Patient has not had mammogram since 2017, plans to schedule this year. Exercises and does yoga daily. No health issues today.  Patient's last menstrual period was 07/06/1997.          Sexually active: Yes.    The current method of family planning is status post hysterectomy.    Exercising: Yes.    cardio, yoga, stretch Smoker:  no  Review of Systems  Constitutional: Negative.   HENT: Negative.   Eyes: Negative.   Respiratory: Negative.   Cardiovascular: Negative.   Gastrointestinal:       Bloating  Genitourinary: Positive for urgency.       Night urination-had this for a long time  Musculoskeletal: Positive for joint pain.  Skin: Negative.   Neurological: Negative.   Endo/Heme/Allergies: Negative.   Psychiatric/Behavioral: Negative.     Health Maintenance: Pap:  8/11 neg History of Abnormal Pap: no MMG:  12/20/15 category c density birads 1:neg Self Breast exams: no Colonoscopy:  10/14 f/u 43yrs BMD:   2017 TDaP:  9/13 Shingles: had older shingles vaccine Pneumonia: not done Hep C and HIV: not done Labs: poct urine-neg   reports that she has never smoked. She has never used smokeless tobacco. She reports that she drinks alcohol. She reports that she does not use drugs.  Past Medical History:  Diagnosis Date  . Arthritis   . Bell's palsy 07/2016  . Bladder stone   . History of syncope    S/P CARDIAC ABLATION OF ARRYTHMIA  . Hypothyroidism   . Personal history of colonic adenomas 04/11/2013  . S/P ablation operation for arrhythmia    1998 BY DR Caryl Comes--  NO ISSUES SINCE    Past Surgical History:  Procedure Laterality Date  .  CARDIAC CATHETERIZATION  2002   with ablation  . CARDIAC ELECTROPHYSIOLOGY STUDY AND ABLATION  1998 (APPROX)  DR Caryl Comes   ARRHYTHMIA (PT UNSURE TYPE BUT HAD SYNCOPAL EPISODES)  PER PT NO S &S SINCE  . COLONOSCOPY W/ POLYPECTOMY  04/07/2013  . CYSTOSCOPY WITH BIOPSY N/A 02/21/2013   Procedure: CYSTOSCOPY WITH BIOPSY  Transurethral laser of bladder stone and foreign body;  Surgeon: Reece Packer, MD;  Location: [facility];  Service: Urology;  Laterality: N/A;  60 mins req for this case   . HOLMIUM LASER APPLICATION N/A [id number]/[id number]   Procedure: HOLMIUM LASER APPLICATION;  Surgeon: Reece Packer, MD;  Location: [facility];  Service: Urology;  Laterality: N/A;  . LAPAROSCOPIC ASSISTED VAGINAL HYSTERECTOMY  1999   W/ RIGHT SALPINGOOPHORECTOMY AND BLADDER TACK SUSPENSION    Current Outpatient Medications  Medication Sig Dispense Refill  . calcium carbonate (OS-CAL) 600 MG TABS tablet Take 600 mg by mouth daily. Chewable = vitamin d    . diphenhydrAMINE (BENADRYL) 25 mg capsule Take 25 mg by mouth at bedtime as needed for allergies.    Marland Kitchen levothyroxine (SYNTHROID, LEVOTHROID) 175 MCG tablet Take 175 mcg by mouth daily before breakfast. 6 days weekly    . loratadine (CLARITIN) 10 MG tablet Take 10 mg by mouth daily.    Marland Kitchen losartan (COZAAR) 50 MG tablet Take 50 mg by mouth every evening.  12  . meloxicam (  MOBIC) 15 MG tablet Take 1 tablet (15 mg total) by mouth daily. 30 tablet 1  . metroNIDAZOLE (METROGEL) 0.75 % gel Apply 1 application topically daily. For rosacea    . Multiple Vitamin (MULTI-VITAMIN DAILY PO) Take 1 tablet by mouth daily.     Marland Kitchen omeprazole (PRILOSEC) 20 MG capsule Take 20 mg by mouth daily.    Marland Kitchen OVER THE COUNTER MEDICATION Take 1 capsule by mouth daily. "Instaflex Advance" joint support supplement    . Psyllium (METAMUCIL PO) Take by mouth. 1tsp 3 times daily    . tretinoin (RETIN-A) 0.025 % cream Apply 1 application topically every evening.    . zolpidem (AMBIEN) 10 MG tablet TAKE 1  TABLET BY MOUTH AT BEDTIME IF NEEDED  1   No current facility-administered medications for this visit.     Family History  Problem Relation Age of Onset  . Other Mother        caricosities  . Hypertension Father   . Prostate cancer Father   . Other Sister        caricosities  . Colon cancer Neg Hx   . Diabetes Neg Hx   . Heart disease Neg Hx   . Kidney disease Neg Hx   . Liver disease Neg Hx     ROS:  Pertinent items are noted in HPI.  Otherwise, a comprehensive ROS was negative.  Exam:   BP 110/64   Pulse 68   Resp 16   Ht 5' 7.75" (1.721 m)   Wt 161 lb (73 kg)   LMP 07/06/1997   BMI 24.66 kg/m  Height: 5' 7.75" (172.1 cm) Ht Readings from Last 3 Encounters:  02/25/18 5' 7.75" (1.721 m)  01/03/16 5\' 8"  (1.727 m)  12/13/15 5\' 8"  (1.727 m)    General appearance: alert, cooperative and appears stated age Head: Normocephalic, without obvious abnormality, atraumatic Neck: no adenopathy, supple, symmetrical, trachea midline and thyroid normal to inspection and palpation Lungs: clear to auscultation bilaterally Breasts: normal appearance, no masses or tenderness, No nipple retraction or dimpling, No nipple discharge or bleeding, No axillary or supraclavicular adenopathy Heart: regular rate and rhythm Abdomen: soft, non-tender; no masses,  no organomegaly Extremities: extremities normal, atraumatic, no cyanosis or edema Skin: Skin color, texture, turgor normal. No rashes or lesions Lymph nodes: Cervical, supraclavicular, and axillary nodes normal. No abnormal inguinal nodes palpated Neurologic: Grossly normal   Pelvic: External genitalia:  no lesions              Urethra:  normal appearing urethra with no masses, tenderness or lesions              Bartholin's and Skene's: normal                 Vagina: normal appearing vagina with normal color and discharge, no lesions              Cervix: absent              Pap taken: No. 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 Patient plans to schedule. She will have BMD with PCP( previous showed Osteoporosis changes).  Pap smear: no  counseled on breast self exam, mammography screening, feminine hygiene, adequate intake of calcium and vitamin D, diet and exercise  return annually or prn  An After Visit Summary was printed and given to the patient.

## 2018-02-25 NOTE — Patient Instructions (Signed)
EXERCISE AND DIET:  We recommended that you start or continue a regular exercise program for good health. Regular exercise means any activity that makes your heart beat faster and makes you sweat.  We recommend exercising at least 30 minutes per day at least 3 days a week, preferably 4 or 5.  We also recommend a diet low in fat and sugar.  Inactivity, poor dietary choices and obesity can cause diabetes, heart attack, stroke, and kidney damage, among others.    ALCOHOL AND SMOKING:  Women should limit their alcohol intake to no more than 7 drinks/beers/glasses of wine (combined, not each!) per week. Moderation of alcohol intake to this level decreases your risk of breast cancer and liver damage. And of course, no recreational drugs are part of a healthy lifestyle.  And absolutely no smoking or even second hand smoke. Most people know smoking can cause heart and lung diseases, but did you know it also contributes to weakening of your bones? Aging of your skin?  Yellowing of your teeth and nails?  CALCIUM AND VITAMIN D:  Adequate intake of calcium and Vitamin D are recommended.  The recommendations for exact amounts of these supplements seem to change often, but generally speaking 600 mg of calcium (either carbonate or citrate) and 800 units of Vitamin D per day seems prudent. Certain women may benefit from higher intake of Vitamin D.  If you are among these women, your doctor will have told you during your visit.    PAP SMEARS:  Pap smears, to check for cervical cancer or precancers,  have traditionally been done yearly, although recent scientific advances have shown that most women can have pap smears less often.  However, every woman still should have a physical exam from her gynecologist every year. It will include a breast check, inspection of the vulva and vagina to check for abnormal growths or skin changes, a visual exam of the cervix, and then an exam to evaluate the size and shape of the uterus and  ovaries.  And after 64 years of age, a rectal exam is indicated to check for rectal cancers. We will also provide age appropriate advice regarding health maintenance, like when you should have certain vaccines, screening for sexually transmitted diseases, bone density testing, colonoscopy, mammograms, etc.   MAMMOGRAMS:  All women over 40 years old should have a yearly mammogram. Many facilities now offer a "3D" mammogram, which may cost around $50 extra out of pocket. If possible,  we recommend you accept the option to have the 3D mammogram performed.  It both reduces the number of women who will be called back for extra views which then turn out to be normal, and it is better than the routine mammogram at detecting truly abnormal areas.    COLONOSCOPY:  Colonoscopy to screen for colon cancer is recommended for all women at age 50.  We know, you hate the idea of the prep.  We agree, BUT, having colon cancer and not knowing it is worse!!  Colon cancer so often starts as a polyp that can be seen and removed at colonscopy, which can quite literally save your life!  And if your first colonoscopy is normal and you have no family history of colon cancer, most women don't have to have it again for 10 years.  Once every ten years, you can do something that may end up saving your life, right?  We will be happy to help you get it scheduled when you are ready.    Be sure to check your insurance coverage so you understand how much it will cost.  It may be covered as a preventative service at no cost, but you should check your particular policy.      Kegel Exercises Kegel exercises help strengthen the muscles that support the rectum, vagina, small intestine, bladder, and uterus. Doing Kegel exercises can help:  Improve bladder and bowel control.  Improve sexual response.  Reduce problems and discomfort during pregnancy.  Kegel exercises involve squeezing your pelvic floor muscles, which are the same muscles you  squeeze when you try to stop the flow of urine. The exercises can be done while sitting, standing, or lying down, but it is best to vary your position. Phase 1 exercises 1. Squeeze your pelvic floor muscles tight. You should feel a tight lift in your rectal area. If you are a female, you should also feel a tightness in your vaginal area. Keep your stomach, buttocks, and legs relaxed. 2. Hold the muscles tight for up to 10 seconds. 3. Relax your muscles. Repeat this exercise 50 times a day or as many times as told by your health care provider. Continue to do this exercise for at least 4-6 weeks or for as long as told by your health care provider. This information is not intended to replace advice given to you by your health care provider. Make sure you discuss any questions you have with your health care provider. Document Released: 06/08/2012 Document Revised: 02/15/2016 Document Reviewed: 05/12/2015 Elsevier Interactive Patient Education  2018 [employer]  

## 2018-03-30 ENCOUNTER — Ambulatory Visit: Payer: 59 | Admitting: Certified Nurse Midwife

## 2018-07-28 ENCOUNTER — Encounter: Payer: Self-pay | Admitting: Internal Medicine

## 2018-12-29 DIAGNOSIS — M179 Osteoarthritis of knee, unspecified: Secondary | ICD-10-CM | POA: Insufficient documentation

## 2018-12-29 DIAGNOSIS — M1710 Unilateral primary osteoarthritis, unspecified knee: Secondary | ICD-10-CM | POA: Insufficient documentation

## 2019-02-28 NOTE — Progress Notes (Signed)
63 y.o. [id number] Married  Caucasian Fe here for annual exam. Post menopausal no HRT. Denies vaginal dryness, using coconut oil for dryness with good results. Sees PCP for aex, labs,hypertension and hypothyroid management. Sees Dr. Ronnie Derby for knee osteoarthritis, having injections now in knee. Denies any urinary incontinence or change in vaginal relaxation. Has not had mammogram "just forgot". No other health issues today.  Patient's last menstrual period was 07/06/1997.          Sexually active: Yes.    The current method of family planning is status post hysterectomy.    Exercising: Yes.    95mins daily Smoker:  no  Review of Systems  Constitutional: Negative.   HENT: Negative.   Eyes: Negative.   Respiratory: Negative.   Cardiovascular: Negative.   Gastrointestinal: Negative.   Genitourinary: Negative.   Musculoskeletal: Negative.   Skin: Negative.   Neurological: Negative.   Endo/Heme/Allergies: Negative.   Psychiatric/Behavioral: Negative.     Health Maintenance: Pap:  8/11 neg History of Abnormal Pap: no MMG:  12-20-15 category c density birads 1:neg Self Breast exams: no Colonoscopy:  2014 f/u 62yrs BMD:   2017 TDaP:  2013 Shingles: had older shingles vaccine Pneumonia: not done Hep C and HIV: not done Labs: if needed   reports that she has never smoked. She has never used smokeless tobacco. She reports current alcohol use. She reports that she does not use drugs.  Past Medical History:  Diagnosis Date  . Arthritis   . Bell's palsy 07/2016  . Bladder stone   . History of syncope    S/P CARDIAC ABLATION OF ARRYTHMIA  . Hypothyroidism   . Osteoporosis   . Personal history of colonic adenomas 04/11/2013  . S/P ablation operation for arrhythmia    1998 BY DR Caryl Comes--  NO ISSUES SINCE    Past Surgical History:  Procedure Laterality Date  . CARDIAC CATHETERIZATION  2002   with ablation  . CARDIAC ELECTROPHYSIOLOGY STUDY AND ABLATION  1998 (APPROX)  DR Caryl Comes    ARRHYTHMIA (PT UNSURE TYPE BUT HAD SYNCOPAL EPISODES)  PER PT NO S &S SINCE  . COLONOSCOPY W/ POLYPECTOMY  04/07/2013  . CYSTOSCOPY WITH BIOPSY N/A 02/21/2013   Procedure: CYSTOSCOPY WITH BIOPSY  Transurethral laser of bladder stone and foreign body;  Surgeon: Reece Packer, MD;  Location: [facility];  Service: Urology;  Laterality: N/A;  60 mins req for this case   . HOLMIUM LASER APPLICATION N/A [id number]   Procedure: HOLMIUM LASER APPLICATION;  Surgeon: Reece Packer, MD;  Location: [facility];  Service: Urology;  Laterality: N/A;  . LAPAROSCOPIC ASSISTED VAGINAL HYSTERECTOMY  1999   W/ RIGHT SALPINGOOPHORECTOMY AND BLADDER TACK SUSPENSION    Current Outpatient Medications  Medication Sig Dispense Refill  . calcium carbonate (OS-CAL) 600 MG TABS tablet Take 600 mg by mouth daily. Chewable = vitamin d    . diphenhydrAMINE (BENADRYL) 25 mg capsule Take 25 mg by mouth at bedtime as needed for allergies.    Marland Kitchen levothyroxine (SYNTHROID, LEVOTHROID) 175 MCG tablet Take 175 mcg by mouth daily before breakfast. 6 days weekly    . loratadine (CLARITIN) 10 MG tablet Take 10 mg by mouth daily.    Marland Kitchen losartan (COZAAR) 50 MG tablet Take 50 mg by mouth every evening.  12  . meloxicam (MOBIC) 15 MG tablet Take 1 tablet (15 mg total) by mouth daily. 30 tablet 1  . metroNIDAZOLE (METROGEL) 0.75 % gel Apply 1 application topically daily. For rosacea    .  Multiple Vitamin (MULTI-VITAMIN DAILY PO) Take 1 tablet by mouth daily.     Marland Kitchen omeprazole (PRILOSEC) 20 MG capsule Take 20 mg by mouth daily.    Marland Kitchen OVER THE COUNTER MEDICATION Take 1 capsule by mouth daily. "Instaflex Advance" joint support supplement    . Psyllium (METAMUCIL PO) Take by mouth. 1tsp 3 times daily    . tretinoin (RETIN-A) 0.025 % cream Apply 1 application topically every evening.    . zolpidem (AMBIEN) 10 MG tablet TAKE 1 TABLET BY MOUTH AT BEDTIME IF NEEDED  1   No current facility-administered medications for this visit.     Family  History  Problem Relation Age of Onset  . Other Mother        caricosities  . Hypertension Father   . Prostate cancer Father   . Other Sister        caricosities  . Colon cancer Neg Hx   . Diabetes Neg Hx   . Heart disease Neg Hx   . Kidney disease Neg Hx   . Liver disease Neg Hx     ROS:  Pertinent items are noted in HPI.  Otherwise, a comprehensive ROS was negative.  Exam:   BP 116/62   Pulse 70   Temp (!) 97.2 F (36.2 C) (Skin)   Resp 16   Ht 5' 7.25" (1.708 m)   Wt 161 lb (73 kg)   LMP 07/06/1997   BMI 25.03 kg/m  Height: 5' 7.25" (170.8 cm) Ht Readings from Last 3 Encounters:  03/01/19 5' 7.25" (1.708 m)  02/25/18 5' 7.75" (1.721 m)  01/03/16 5\' 8"  (1.727 m)    General appearance: alert, cooperative and appears stated age Head: Normocephalic, without obvious abnormality, atraumatic Neck: no adenopathy, supple, symmetrical, trachea midline and thyroid normal to inspection and palpation Lungs: clear to auscultation bilaterally Breasts: normal appearance, no masses or tenderness, No nipple retraction or dimpling, No nipple discharge or bleeding, No axillary or supraclavicular adenopathy Heart: regular rate and rhythm Abdomen: soft, non-tender; no masses,  no organomegaly Extremities: extremities normal, atraumatic, no cyanosis or edema Skin: Skin color, texture, turgor normal. No rashes or lesions Lymph nodes: Cervical, supraclavicular, and axillary nodes normal. No abnormal inguinal nodes palpated Neurologic: Grossly normal   Pelvic: External genitalia:  no lesions              Urethra:  normal appearing urethra with no masses, tenderness or lesions              Bartholin's and Skene's: normal                 Vagina: normal appearing vagina with normal color and discharge, no lesions              Cervix: absent              Pap taken: No. Bimanual Exam:  Uterus:  uterus absent              Adnexa: normal adnexa, no mass, fullness, tenderness and right  adnexal surgically absent               Rectovaginal: Confirms               Anus:  normal sphincter tone, no lesions  Chaperone present: yes  A:  Well Woman with normal exam  Post menopausal no HRT s/p TVH with RSO  Vaginal wall relaxation, not symptomatic  Hypothyroid, hypertension management with PCP  Mammogram and BMD due  P:   Reviewed health and wellness pertinent to exam  Discussed if becomes symptomatic will notify  Continue follow up with PCP as indicate  Discussed importance of mammogram and BMD due. Order placed for BMD and patient will call for appointment for mammogram and BMD.  Pap smear: no   counseled on breast self exam, mammography screening, feminine hygiene, adequate intake of calcium and vitamin D, diet and exercise, Kegel's exercises  return annually or prn  An After Visit Summary was printed and given to the patient.

## 2019-03-01 ENCOUNTER — Encounter: Payer: Self-pay | Admitting: Certified Nurse Midwife

## 2019-03-01 ENCOUNTER — Ambulatory Visit (INDEPENDENT_AMBULATORY_CARE_PROVIDER_SITE_OTHER): Payer: Medicare Other | Admitting: Certified Nurse Midwife

## 2019-03-01 ENCOUNTER — Other Ambulatory Visit: Payer: Self-pay

## 2019-03-01 VITALS — BP 116/62 | HR 70 | Temp 97.2°F | Resp 16 | Ht 67.25 in | Wt 161.0 lb

## 2019-03-01 DIAGNOSIS — Z780 Asymptomatic menopausal state: Secondary | ICD-10-CM

## 2019-03-01 DIAGNOSIS — R29890 Loss of height: Secondary | ICD-10-CM | POA: Diagnosis not present

## 2019-03-01 DIAGNOSIS — Z124 Encounter for screening for malignant neoplasm of cervix: Secondary | ICD-10-CM

## 2019-03-01 DIAGNOSIS — Z01419 Encounter for gynecological examination (general) (routine) without abnormal findings: Secondary | ICD-10-CM | POA: Diagnosis not present

## 2019-03-01 NOTE — Patient Instructions (Signed)

## 2019-03-03 ENCOUNTER — Other Ambulatory Visit: Payer: Self-pay | Admitting: Certified Nurse Midwife

## 2019-03-03 DIAGNOSIS — Z1231 Encounter for screening mammogram for malignant neoplasm of breast: Secondary | ICD-10-CM

## 2019-03-14 DIAGNOSIS — M79672 Pain in left foot: Secondary | ICD-10-CM | POA: Insufficient documentation

## 2019-03-14 DIAGNOSIS — M19079 Primary osteoarthritis, unspecified ankle and foot: Secondary | ICD-10-CM | POA: Insufficient documentation

## 2019-04-13 ENCOUNTER — Ambulatory Visit
Admission: RE | Admit: 2019-04-13 | Discharge: 2019-04-13 | Disposition: A | Discharge status: Home / Self Care | Payer: Medicare Other | Source: Ambulatory Visit | Attending: Certified Nurse Midwife | Admitting: Certified Nurse Midwife

## 2019-04-13 ENCOUNTER — Ambulatory Visit: Payer: Self-pay

## 2019-04-13 ENCOUNTER — Other Ambulatory Visit: Payer: Self-pay

## 2019-04-13 DIAGNOSIS — Z1231 Encounter for screening mammogram for malignant neoplasm of breast: Secondary | ICD-10-CM

## 2019-08-01 ENCOUNTER — Ambulatory Visit: Payer: Medicare Other

## 2019-08-10 ENCOUNTER — Ambulatory Visit: Payer: Medicare Other

## 2019-08-29 ENCOUNTER — Ambulatory Visit: Payer: Medicare Other

## 2019-09-27 ENCOUNTER — Encounter: Payer: Self-pay | Admitting: Certified Nurse Midwife

## 2019-10-04 DIAGNOSIS — M19079 Primary osteoarthritis, unspecified ankle and foot: Secondary | ICD-10-CM | POA: Insufficient documentation

## 2020-02-14 ENCOUNTER — Other Ambulatory Visit: Payer: Self-pay

## 2020-02-14 ENCOUNTER — Ambulatory Visit: Payer: Medicare Other | Admitting: Cardiology

## 2020-02-16 ENCOUNTER — Encounter: Payer: Self-pay | Admitting: Cardiology

## 2020-02-16 ENCOUNTER — Other Ambulatory Visit: Payer: Self-pay

## 2020-02-16 ENCOUNTER — Telehealth: Payer: Self-pay | Admitting: Radiology

## 2020-02-16 ENCOUNTER — Ambulatory Visit (INDEPENDENT_AMBULATORY_CARE_PROVIDER_SITE_OTHER): Payer: Medicare Other | Admitting: Cardiology

## 2020-02-16 ENCOUNTER — Other Ambulatory Visit (HOSPITAL_COMMUNITY)
Admission: RE | Admit: 2020-02-16 | Discharge: 2020-02-16 | Disposition: A | Discharge status: Home / Self Care | Payer: Medicare Other | Source: Ambulatory Visit | Attending: Cardiology | Admitting: Cardiology

## 2020-02-16 VITALS — BP 130/82 | HR 68 | Temp 97.0°F | Ht 68.0 in | Wt 160.0 lb

## 2020-02-16 DIAGNOSIS — Z20822 Contact with and (suspected) exposure to covid-19: Secondary | ICD-10-CM | POA: Diagnosis not present

## 2020-02-16 DIAGNOSIS — Z01812 Encounter for preprocedural laboratory examination: Secondary | ICD-10-CM | POA: Diagnosis present

## 2020-02-16 DIAGNOSIS — I200 Unstable angina: Secondary | ICD-10-CM

## 2020-02-16 DIAGNOSIS — R002 Palpitations: Secondary | ICD-10-CM | POA: Diagnosis not present

## 2020-02-16 DIAGNOSIS — Z7189 Other specified counseling: Secondary | ICD-10-CM

## 2020-02-16 DIAGNOSIS — I10 Essential (primary) hypertension: Secondary | ICD-10-CM | POA: Diagnosis not present

## 2020-02-16 DIAGNOSIS — E782 Mixed hyperlipidemia: Secondary | ICD-10-CM

## 2020-02-16 LAB — BASIC METABOLIC PANEL
BUN/Creatinine Ratio: 33 — ABNORMAL HIGH (ref 12–28)
BUN: 20 mg/dL (ref 8–27)
CO2: 27 mmol/L (ref 20–29)
Calcium: 9.8 mg/dL (ref 8.7–10.3)
Chloride: 103 mmol/L (ref 96–106)
Creatinine, Ser: 0.6 mg/dL (ref 0.57–1.00)
GFR calc Af Amer: 110 mL/min/{1.73_m2} (ref >59)
GFR calc non Af Amer: 95 mL/min/{1.73_m2} (ref >59)
Glucose: 73 mg/dL (ref 65–99)
Potassium: 4.3 mmol/L (ref 3.5–5.2)
Sodium: 142 mmol/L (ref 134–144)

## 2020-02-16 LAB — SARS CORONAVIRUS 2 (TAT 6-24 HRS): SARS Coronavirus 2: NEGATIVE

## 2020-02-16 LAB — CBC
Hematocrit: 41.9 % (ref 34.0–46.6)
Hemoglobin: 14.4 g/dL (ref 11.1–15.9)
MCH: 31.2 pg (ref 26.6–33.0)
MCHC: 34.4 g/dL (ref 31.5–35.7)
MCV: 91 fL (ref 79–97)
Platelets: 174 10*3/uL (ref 150–450)
RBC: 4.62 x10E6/uL (ref 3.77–5.28)
RDW: 11.9 % (ref 11.7–15.4)
WBC: 4.7 10*3/uL (ref 3.4–10.8)

## 2020-02-16 NOTE — H&P (View-Only) (Signed)
Cardiology Office Note:    Date:  02/16/2020   ID:  Natalie Schultz, Natalie Schultz [birth year], MRN [id number]  PCP:  Leanna Battles, MD  Cardiologist:  Buford Dresser, MD  Referring MD: Leanna Battles, MD   CC: racing heart beats/chest discomfort  History of Present Illness:    Natalie Schultz is a 64 y.o. female with a hx of palpitations who is seen as a new consult at the request of Leanna Battles, MD for the evaluation and management of chest pain.  Unfortunately there are no referral notes or recent records available for review today.  Patient concerns today: -has been checked BP and HR. Occasionally sees HR to 200 bpm.  -chest discomfort, below  Chest pain: Started several weeks ago. Saw Dr. Philip Aspen 7/26, had been going on for a week at that time. Works out 6-7 days/week, but symptoms largely came on when sitting still or doing light activity. Had fast heart rate, chest tightness, jaw pain, left arm pain. Feels flushed, nauseated, sweaty with events. Happening at least daily, sometimes more than once a day. Arm/neck/jaw are a tingling sensation, not always related to events of shortness of breath/racing beats. Chest is heavy, feels like she can't take a deep breath. Rarely a pressure, but more shortness of breath. Has stopped all caffeine, but no change to symptoms.   -Prior cardiac history: prior catheter ablation remotely for "abnormal beats," no coronary angiography. Remote chemical nuclear stress test, no records, told it was normal.  -Alcohol: 1 glass of wine 1-2 times/week -Tobacco: never -Comorbidities: hypothyroidism on levothyroxine (no change to dose), hypertension. No diabetes, but has been told she is borderline high cholesterol. Tried a statin in the past (years ago, doesn't remember which one) and felt terrible on it. -Cardiac ROS: no PND, no orthopnea, no LE edema, no syncope. Mild lightheadedness/vertigo. -Family history: father has a heart  murmur, history of heart failure recently at age 81.   BP logs reviewed. Average 120-130/80, highest 155/90, lowest 108/68. HR 60-70s.  Past Medical History:  Diagnosis Date  . Arthritis   . Bell's palsy 07/2016  . Bladder stone   . History of syncope    S/P CARDIAC ABLATION OF ARRYTHMIA  . Hypothyroidism   . Osteoporosis   . Personal history of colonic adenomas 04/11/2013  . S/P ablation operation for arrhythmia    1998 BY DR Caryl Comes--  NO ISSUES SINCE    Past Surgical History:  Procedure Laterality Date  . CARDIAC CATHETERIZATION  2002   with ablation  . CARDIAC ELECTROPHYSIOLOGY STUDY AND ABLATION  1998 (APPROX)  DR Caryl Comes   ARRHYTHMIA (PT UNSURE TYPE BUT HAD SYNCOPAL EPISODES)  PER PT NO S &S SINCE  . COLONOSCOPY W/ POLYPECTOMY  04/07/2013  . CYSTOSCOPY WITH BIOPSY N/A 02/21/2013   Procedure: CYSTOSCOPY WITH BIOPSY  Transurethral laser of bladder stone and foreign body;  Surgeon: Reece Packer, MD;  Location: [facility];  Service: Urology;  Laterality: N/A;  60 mins req for this case   . HOLMIUM LASER APPLICATION N/A 2/70/6237   Procedure: HOLMIUM LASER APPLICATION;  Surgeon: Reece Packer, MD;  Location: [facility];  Service: Urology;  Laterality: N/A;  . LAPAROSCOPIC ASSISTED VAGINAL HYSTERECTOMY  1999   W/ RIGHT SALPINGOOPHORECTOMY AND BLADDER TACK SUSPENSION    Current Medications: Current Outpatient Medications on File Prior to Visit  Medication Sig  . acetaminophen (TYLENOL) 650 MG CR tablet Take 650 mg by mouth every 8 (eight) hours as needed for pain. As needed  for pain  . calcium carbonate (OS-CAL) 600 MG TABS tablet Take 600 mg by mouth daily. Chewable = vitamin d  . diphenhydrAMINE (BENADRYL) 25 mg capsule Take 25 mg by mouth at bedtime as needed for allergies.  Marland Kitchen levothyroxine (SYNTHROID, LEVOTHROID) 175 MCG tablet Take 175 mcg by mouth daily before breakfast. 6 days weekly  . loratadine (CLARITIN) 10 MG tablet Take 10 mg by mouth daily.  Marland Kitchen losartan (COZAAR) 50  MG tablet Take 50 mg by mouth every evening.  . meloxicam (MOBIC) 15 MG tablet Take 1 tablet (15 mg total) by mouth daily.  . metroNIDAZOLE (METROGEL) 0.75 % gel Apply 1 application topically daily. For rosacea  . Multiple Vitamin (MULTI-VITAMIN DAILY PO) Take 1 tablet by mouth daily.   Marland Kitchen omeprazole (PRILOSEC) 20 MG capsule Take 20 mg by mouth daily.  Marland Kitchen OVER THE COUNTER MEDICATION Take 1 capsule by mouth daily. "Instaflex Advance" joint support supplement  . Psyllium (METAMUCIL PO) Take by mouth. 1tsp 3 times daily  . RESVERATROL PO Take 700 mg by mouth. 2 tablets Daily  . tretinoin (RETIN-A) 0.025 % cream Apply 1 application topically every evening.  . zinc gluconate 50 MG tablet Take 50 mg by mouth daily. 1 Daily   No current facility-administered medications on file prior to visit.     Allergies:   Amoxicillin, Erythromycin, and Biaxin [clarithromycin]   Social History   Tobacco Use  . Smoking status: Never Smoker  . Smokeless tobacco: Never Used  Substance Use Topics  . Alcohol use: Yes    Alcohol/week: 0.0 standard drinks    Comment: occ wine  . Drug use: No    Family History: family history includes Hypertension in her father; Other in her mother and sister; Prostate cancer in her father. There is no history of Colon cancer, Diabetes, Heart disease, Kidney disease, or Liver disease.  ROS:   Please see the history of present illness.  Additional pertinent ROS: Constitutional: Negative for chills, fever, night sweats, unintentional weight loss  HENT: Negative for ear pain and hearing loss.   Eyes: Negative for loss of vision and eye pain.  Respiratory: Negative for cough, sputum, wheezing.   Cardiovascular: See HPI. Gastrointestinal: Negative for abdominal pain, melena, and hematochezia.  Genitourinary: Negative for dysuria and hematuria.  Musculoskeletal: Negative for falls and myalgias.  Skin: Negative for itching and rash.  Neurological: Negative for focal weakness,  focal sensory changes and loss of consciousness.  Endo/Heme/Allergies: Does not bruise/bleed easily.     EKGs/Labs/Other Studies Reviewed:    The following studies were reviewed today: None available  EKG:  EKG is personally reviewed.  The ekg ordered today demonstrates NSR, nonspecific t wave flattening, HR 68  Recent Labs: No results found for requested labs within last 8760 hours.  Recent Lipid Panel No results found for: CHOL, TRIG, HDL, CHOLHDL, VLDL, LDLCALC, LDLDIRECT  Physical Exam:    VS:  BP 130/82   Pulse 68   Temp (!) 97 F (36.1 C)   Ht 5\' 8"  (1.727 m)   Wt 160 lb (72.6 kg)   LMP 07/06/1997   SpO2 99%   BMI 24.33 kg/m     Wt Readings from Last 3 Encounters:  02/16/20 160 lb (72.6 kg)  03/01/19 161 lb (73 kg)  02/25/18 161 lb (73 kg)    GEN: Well nourished, well developed in no acute distress HEENT: Normal, moist mucous membranes NECK: No JVD CARDIAC: regular rhythm, normal S1 and S2, no rubs or gallops.  No murmurs. VASCULAR: Radial and DP pulses 2+ bilaterally. No carotid bruits RESPIRATORY:  Clear to auscultation without rales, wheezing or rhonchi  ABDOMEN: Soft, non-tender, non-distended MUSCULOSKELETAL:  Ambulates independently SKIN: Warm and dry, no edema NEUROLOGIC:  Alert and oriented x 3. No focal neuro deficits noted. PSYCHIATRIC:  Normal affect    ASSESSMENT:    1. Unstable angina (HCC)   2. Palpitations   3. Essential hypertension   4. Cardiac risk counseling   5. Counseling on health promotion and disease prevention   6. Pre-procedure lab exam   7. Mixed hyperlipidemia    PLAN:    Chest discomfort: very concerning symptoms, suggestive of accelerating/unstable angina -discussed options for management. After shared decision making, will pursue cath ASAP -Risks and benefits of cardiac catheterization have been discussed with the patient.  These include bleeding, infection, kidney damage, stroke, heart attack, death.  The patient  understands these risks and is willing to proceed. -strongly counseled on red flag warning signs that need a call to 911 -discussed aspirin, statin, beta blocker. She would like to know if this is her heart first. Strongly recommend at least aspirin 81 mg. Would stop meloxicam -labs today for cath  Palpitations/tachycardia: -2 week Zio -echo  Hypertension: at goal today -continue losartan  Mixed hyperlipidemia: -per KPN, Tchol 252, HDL 55, LDL 165, TG 162 -recommended statin, she would like to have cath first  Cardiac risk counseling and prevention recommendations: -recommend heart healthy/Mediterranean diet, with whole grains, fruits, vegetable, fish, lean meats, nuts, and olive oil. Limit salt. -recommend moderate walking, 3-5 times/week for 30-50 minutes each session. Aim for at least 150 minutes.week. Goal should be pace of 3 miles/hours, or walking 1.5 miles in 30 minutes -recommend avoidance of tobacco products. Avoid excess alcohol.   Plan for follow up: 3 weeks  Buford Dresser, MD, PhD [place]  [facility]    Medication Adjustments/Labs and Tests Ordered: Current medicines are reviewed at length with the patient today.  Concerns regarding medicines are outlined above.  Orders Placed This Encounter  Procedures  . Basic metabolic panel  . CBC  . LONG TERM MONITOR (3-14 DAYS)  . EKG 12-Lead  . ECHOCARDIOGRAM COMPLETE   No orders of the defined types were placed in this encounter.   Patient Instructions  Medication Instructions:  Your Physician recommend you continue on your current medication as directed.    *If you need a refill on your cardiac medications before your next appointment, please call your pharmacy*   Lab Work: Your physician recommends that you return for lab work today ( CBC, BMP)  If you have labs (blood work) drawn today and your tests are completely normal, you will receive your results only by: Marland Kitchen MyChart Message (if you have  MyChart) OR . A paper copy in the mail If you have any lab test that is abnormal or we need to change your treatment, we will call you to review the results.   Testing/Procedures: Your physician has requested that you have a cardiac catheterization. Cardiac catheterization is used to diagnose and/or treat various heart conditions. Doctors may recommend this procedure for a number of different reasons. The most common reason is to evaluate chest pain. Chest pain can be a symptom of coronary artery disease (CAD), and cardiac catheterization can show whether plaque is narrowing or blocking your heart's arteries. This procedure is also used to evaluate the valves, as well as measure the blood flow and oxygen levels in different parts of  your heart. For further information please visit [email or web address]. Please follow instruction sheet, as given. [place] will need to have the coronavirus test completed prior to your procedure. An appointment has been made at 11:35 am on 8/13. This is a Drive Up Visit at [address], [place], [place] [address]. Please tell them that you are there for procedure testing. Stay in your car and someone will be with you shortly. Please make sure to have all other labs completed before this test because you will need to stay quarantined until your procedure.  Your physician has requested that you have an echocardiogram. Echocardiography is a painless test that uses sound waves to create images of your heart. It provides your doctor with information about the size and shape of your heart and how well your heart's chambers and valves are working. This procedure takes approximately one hour. There are no restrictions for this procedure. [place] [address]  Our physician has recommended that you wear an 14  DAY ZIO-PATCH monitor. The Zio patch cardiac monitor continuously records heart rhythm data for up to 14 days, this is for patients being  evaluated for multiple types heart rhythms. For the first 24 hours post application, please avoid getting the Zio monitor wet in the shower or by excessive sweating during exercise. After that, feel free to carry on with regular activities. Keep soaps and lotions away from the ZIO XT Patch.   Someone from our office will call to mail monitor.         Follow-Up: At [facility], you and your health needs are our priority.  As part of our continuing mission to provide you with exceptional heart care, we have created designated Provider Care Teams.  These Care Teams include your primary Cardiologist (physician) and Advanced Practice Providers (APPs -  Physician Assistants and Nurse Practitioners) who all work together to provide you with the care you need, when you need it.  We recommend signing up for the patient portal called "MyChart".  Sign up information is provided on this After Visit Summary.  MyChart is used to connect with patients for Virtual Visits (Telemedicine).  Patients are able to view lab/test results, encounter notes, upcoming appointments, etc.  Non-urgent messages can be sent to your provider as well.   To learn more about what you can do with MyChart, go to [email or web address].    Your next appointment:   3 week(s)  The format for your next appointment:   In Person  Provider:   Buford Dresser, MD     [place] [address] Dept: [phone] Loc: [place]  02/16/2020  You are scheduled for a Cardiac Catheterization on Tuesday, August 17 with Dr. Peter Martinique.  1. Please arrive at the [facility] (Main Entrance A) at [facility]: [address] [place], [place] [address] at 7:00 AM (This time is two hours before your procedure to ensure your preparation). Free valet parking service is available.   Special  note: Every effort is made to have your procedure done on time. Please understand that emergencies sometimes delay scheduled procedures.  2. Diet: Do not eat solid foods after midnight.  The patient may have clear liquids until 5am upon the day of the procedure.  3. Labs: You will need to have blood drawn today ( CBC, BMP)  4. Medication instructions in preparation  for your procedure:   Contrast Allergy: No   On the morning of your procedure, take your Aspirin and any morning medicines NOT listed above.  You may use sips of water.  5. Plan for one night stay--bring personal belongings. 6. Bring a current list of your medications and current insurance cards. 7. You MUST have a responsible person to drive you home. 8. Someone MUST be with you the first 24 hours after you arrive home or your discharge will be delayed. 9. Please wear clothes that are easy to get on and off and wear slip-on shoes.  Thank you for allowing [place] to care for you!   -- [place] [facility]  ZIO XT- Long Term Monitor Instructions   Your physician has requested you wear your ZIO patch monitor__14_____days.   This is a single patch monitor.  [employer] supplies one patch monitor per enrollment.  Additional stickers are not available.   Please do not apply patch if you will be having a Nuclear Stress Test, Echocardiogram, Cardiac CT, MRI, or Chest Xray during the time frame you would be wearing the monitor. The patch cannot be worn during these tests.  You cannot remove and re-apply the ZIO XT patch monitor.   Your ZIO patch monitor will be sent USPS Priority mail from [facility] directly to your home address. The monitor may also be mailed to a PO BOX if home delivery is not available.   It may take 3-5 days to receive your monitor after you have been enrolled.   Once you have received you monitor, please review enclosed instructions.  Your monitor has already been registered assigning  a specific monitor serial # to you.   Applying the monitor   Shave hair from upper left chest.   Hold abrader disc by orange tab.  Rub abrader in 40 strokes over left upper chest as indicated in your monitor instructions.   Clean area with 4 enclosed alcohol pads .  Use all pads to assure are is cleaned thoroughly.  Let dry.   Apply patch as indicated in monitor instructions.  Patch will be place under collarbone on left side of chest with arrow pointing upward.   Rub patch adhesive wings for 2 minutes.Remove white label marked "1".  Remove white label marked "2".  Rub patch adhesive wings for 2 additional minutes.   While looking in a mirror, press and release button in center of patch.  A small green light will flash 3-4 times .  This will be your only indicator the monitor has been turned on.     Do not shower for the first 24 hours.  You may shower after the first 24 hours.   Press button if you feel a symptom. You will hear a small click.  Record Date, Time and Symptom in the Patient Log Book.   When you are ready to remove patch, follow instructions on last 2 pages of Patient Log Book.  Stick patch monitor onto last page of Patient Log Book.   Place Patient Log Book in [place] box.  Use locking tab on box and tape box closed securely.  The [employer] has [employer] on it.  Please place in mailbox as soon as possible.  Your physician should have your test results approximately 7 days after the monitor has been mailed back to [facility].   Call [place] at [phone] if you have questions regarding your ZIO XT patch monitor.  Call them immediately if you see an orange light blinking on your monitor.   If your monitor falls off in less than 4 days contact our Monitor department at [phone].  If your monitor becomes loose or falls off after 4 days call [employer] at [phone] for suggestions on securing your monitor.     Signed, Buford Dresser, MD PhD 02/16/2020  [place]

## 2020-02-16 NOTE — Telephone Encounter (Signed)
Enrolled patient for a 14 day Zio XT  monitor to be mailed to patients home  °

## 2020-02-16 NOTE — Patient Instructions (Addendum)
Medication Instructions:  Your Physician recommend you continue on your current medication as directed.    *If you need a refill on your cardiac medications before your next appointment, please call your pharmacy*   Lab Work: Your physician recommends that you return for lab work today ( CBC, BMP)  If you have labs (blood work) drawn today and your tests are completely normal, you will receive your results only by: Marland Kitchen MyChart Message (if you have MyChart) OR . A paper copy in the mail If you have any lab test that is abnormal or we need to change your treatment, we will call you to review the results.   Testing/Procedures: Your physician has requested that you have a cardiac catheterization. Cardiac catheterization is used to diagnose and/or treat various heart conditions. Doctors may recommend this procedure for a number of different reasons. The most common reason is to evaluate chest pain. Chest pain can be a symptom of coronary artery disease (CAD), and cardiac catheterization can show whether plaque is narrowing or blocking your heart's arteries. This procedure is also used to evaluate the valves, as well as measure the blood flow and oxygen levels in different parts of your heart. For further information please visit [email or web address]. Please follow instruction sheet, as given. [place] will need to have the coronavirus test completed prior to your procedure. An appointment has been made at 11:35 am on 8/13. This is a Drive Up Visit at [address], [place], [place] [address]. Please tell them that you are there for procedure testing. Stay in your car and someone will be with you shortly. Please make sure to have all other labs completed before this test because you will need to stay quarantined until your procedure.  Your physician has requested that you have an echocardiogram. Echocardiography is a painless test that uses sound waves to create images of your heart.  It provides your doctor with information about the size and shape of your heart and how well your heart's chambers and valves are working. This procedure takes approximately one hour. There are no restrictions for this procedure. [place] [address]  Our physician has recommended that you wear an 14  DAY ZIO-PATCH monitor. The Zio patch cardiac monitor continuously records heart rhythm data for up to 14 days, this is for patients being evaluated for multiple types heart rhythms. For the first 24 hours post application, please avoid getting the Zio monitor wet in the shower or by excessive sweating during exercise. After that, feel free to carry on with regular activities. Keep soaps and lotions away from the ZIO XT Patch.   Someone from our office will call to mail monitor.         Follow-Up: At [facility], you and your health needs are our priority.  As part of our continuing mission to provide you with exceptional heart care, we have created designated Provider Care Teams.  These Care Teams include your primary Cardiologist (physician) and Advanced Practice Providers (APPs -  Physician Assistants and Nurse Practitioners) who all work together to provide you with the care you need, when you need it.  We recommend signing up for the patient portal called "MyChart".  Sign up information is provided on this After Visit Summary.  MyChart is used to connect with patients for Virtual Visits (Telemedicine).  Patients are able to view lab/test results, encounter notes, upcoming appointments, etc.  Non-urgent messages can be sent to your provider as  well.   To learn more about what you can do with MyChart, go to [email or web address].    Your next appointment:   3 week(s)  The format for your next appointment:   In Person  Provider:   Buford Dresser, MD     [address]  [place] [address] Dept: [phone] Loc: [place]  02/16/2020  You are scheduled for a Cardiac Catheterization on Tuesday, August 17 with Dr. Peter Martinique.  1. Please arrive at the [facility] (Main Entrance A) at [facility]: [address] [place], [place] [address] at 7:00 AM (This time is two hours before your procedure to ensure your preparation). Free valet parking service is available.   Special note: Every effort is made to have your procedure done on time. Please understand that emergencies sometimes delay scheduled procedures.  2. Diet: Do not eat solid foods after midnight.  The patient may have clear liquids until 5am upon the day of the procedure.  3. Labs: You will need to have blood drawn today ( CBC, BMP)  4. Medication instructions in preparation for your procedure:   Contrast Allergy: No   On the morning of your procedure, take your Aspirin and any morning medicines NOT listed above.  You may use sips of water.  5. Plan for one night stay--bring personal belongings. 6. Bring a current list of your medications and current insurance cards. 7. You MUST have a responsible person to drive you home. 8. Someone MUST be with you the first 24 hours after you arrive home or your discharge will be delayed. 9. Please wear clothes that are easy to get on and off and wear slip-on shoes.  Thank you for allowing [place] to care for you!   -- [facility]  ZIO XT- Long Term Monitor Instructions   Your physician has requested you wear your ZIO patch monitor__14_____days.   This is a single patch monitor.  [employer] supplies one patch monitor per enrollment.  Additional stickers are not available.   Please do not apply patch if you will be having a Nuclear Stress Test, Echocardiogram, Cardiac CT, MRI, or Chest Xray during the time frame you would be wearing the monitor. The patch cannot be worn during these  tests.  You cannot remove and re-apply the ZIO XT patch monitor.   Your ZIO patch monitor will be sent USPS Priority mail from [facility] directly to your home address. The monitor may also be mailed to a PO BOX if home delivery is not available.   It may take 3-5 days to receive your monitor after you have been enrolled.   Once you have received you monitor, please review enclosed instructions.  Your monitor has already been registered assigning a specific monitor serial # to you.   Applying the monitor   Shave hair from upper left chest.   Hold abrader disc by orange tab.  Rub abrader in 40 strokes over left upper chest as indicated in your monitor instructions.   Clean area with 4 enclosed alcohol pads .  Use all pads to assure are is cleaned thoroughly.  Let dry.   Apply patch as indicated in monitor instructions.  Patch will be place under collarbone on left side of chest with arrow pointing upward.   Rub patch adhesive wings for 2 minutes.Remove white label marked "1".  Remove white label marked "2".  Rub patch adhesive wings for 2 additional minutes.   While looking in a mirror, press and release button in center of patch.  A small green light will flash 3-4 times .  This will be your only indicator the monitor has been turned on.     Do not shower for the first 24 hours.  You may shower after the first 24 hours.   Press button if you feel a symptom. You will hear a small click.  Record Date, Time and Symptom in the Patient Log Book.   When you are ready to remove patch, follow instructions on last 2 pages of Patient Log Book.  Stick patch monitor onto last page of Patient Log Book.   Place Patient Log Book in [place] box.  Use locking tab on box and tape box closed securely.  The [employer] has [employer] on it.  Please place in mailbox as soon as possible.  Your physician should have your test results approximately 7 days after the monitor has been mailed back  to [facility].   Call [place] at [phone] if you have questions regarding your ZIO XT patch monitor.  Call them immediately if you see an orange light blinking on your monitor.   If your monitor falls off in less than 4 days contact our Monitor department at [phone].  If your monitor becomes loose or falls off after 4 days call [employer] at [phone] for suggestions on securing your monitor.

## 2020-02-16 NOTE — Progress Notes (Signed)
Cardiology Office Note:    Date:  02/16/2020   ID:  Schultz, Natalie 1956-06-01, MRN [id number]  PCP:  Leanna Battles, MD  Cardiologist:  Buford Dresser, MD  Referring MD: Leanna Battles, MD   CC: racing heart beats/chest discomfort  History of Present Illness:    Natalie Schultz is a 64 y.o. female with a hx of palpitations who is seen as a new consult at the request of Leanna Battles, MD for the evaluation and management of chest pain.  Unfortunately there are no referral notes or recent records available for review today.  Patient concerns today: -has been checked BP and HR. Occasionally sees HR to 200 bpm.  -chest discomfort, below  Chest pain: Started several weeks ago. Saw Dr. Philip Aspen 7/26, had been going on for a week at that time. Works out 6-7 days/week, but symptoms largely came on when sitting still or doing light activity. Had fast heart rate, chest tightness, jaw pain, left arm pain. Feels flushed, nauseated, sweaty with events. Happening at least daily, sometimes more than once a day. Arm/neck/jaw are a tingling sensation, not always related to events of shortness of breath/racing beats. Chest is heavy, feels like she can't take a deep breath. Rarely a pressure, but more shortness of breath. Has stopped all caffeine, but no change to symptoms.   -Prior cardiac history: prior catheter ablation remotely for "abnormal beats," no coronary angiography. Remote chemical nuclear stress test, no records, told it was normal.  -Alcohol: 1 glass of wine 1-2 times/week -Tobacco: never -Comorbidities: hypothyroidism on levothyroxine (no change to dose), hypertension. No diabetes, but has been told she is borderline high cholesterol. Tried a statin in the past (years ago, doesn't remember which one) and felt terrible on it. -Cardiac ROS: no PND, no orthopnea, no LE edema, no syncope. Mild lightheadedness/vertigo. -Family history: father has a heart  murmur, history of heart failure recently at age 15.   BP logs reviewed. Average 120-130/80, highest 155/90, lowest 108/68. HR 60-70s.  Past Medical History:  Diagnosis Date  . Arthritis   . Bell's palsy 07/2016  . Bladder stone   . History of syncope    S/P CARDIAC ABLATION OF ARRYTHMIA  . Hypothyroidism   . Osteoporosis   . Personal history of colonic adenomas 04/11/2013  . S/P ablation operation for arrhythmia    1998 BY DR Caryl Comes--  NO ISSUES SINCE    Past Surgical History:  Procedure Laterality Date  . CARDIAC CATHETERIZATION  2002   with ablation  . CARDIAC ELECTROPHYSIOLOGY STUDY AND ABLATION  1998 (APPROX)  DR Caryl Comes   ARRHYTHMIA (PT UNSURE TYPE BUT HAD SYNCOPAL EPISODES)  PER PT NO S &S SINCE  . COLONOSCOPY W/ POLYPECTOMY  04/07/2013  . CYSTOSCOPY WITH BIOPSY N/A 02/21/2013   Procedure: CYSTOSCOPY WITH BIOPSY  Transurethral laser of bladder stone and foreign body;  Surgeon: Reece Packer, MD;  Location: [facility];  Service: Urology;  Laterality: N/A;  60 mins req for this case   . HOLMIUM LASER APPLICATION N/A 12/30/348   Procedure: HOLMIUM LASER APPLICATION;  Surgeon: Reece Packer, MD;  Location: [facility];  Service: Urology;  Laterality: N/A;  . LAPAROSCOPIC ASSISTED VAGINAL HYSTERECTOMY  1999   W/ RIGHT SALPINGOOPHORECTOMY AND BLADDER TACK SUSPENSION    Current Medications: Current Outpatient Medications on File Prior to Visit  Medication Sig  . acetaminophen (TYLENOL) 650 MG CR tablet Take 650 mg by mouth every 8 (eight) hours as needed for pain. As needed  for pain  . calcium carbonate (OS-CAL) 600 MG TABS tablet Take 600 mg by mouth daily. Chewable = vitamin d  . diphenhydrAMINE (BENADRYL) 25 mg capsule Take 25 mg by mouth at bedtime as needed for allergies.  Marland Kitchen levothyroxine (SYNTHROID, LEVOTHROID) 175 MCG tablet Take 175 mcg by mouth daily before breakfast. 6 days weekly  . loratadine (CLARITIN) 10 MG tablet Take 10 mg by mouth daily.  Marland Kitchen losartan (COZAAR) 50  MG tablet Take 50 mg by mouth every evening.  . meloxicam (MOBIC) 15 MG tablet Take 1 tablet (15 mg total) by mouth daily.  . metroNIDAZOLE (METROGEL) 0.75 % gel Apply 1 application topically daily. For rosacea  . Multiple Vitamin (MULTI-VITAMIN DAILY PO) Take 1 tablet by mouth daily.   Marland Kitchen omeprazole (PRILOSEC) 20 MG capsule Take 20 mg by mouth daily.  Marland Kitchen OVER THE COUNTER MEDICATION Take 1 capsule by mouth daily. "Instaflex Advance" joint support supplement  . Psyllium (METAMUCIL PO) Take by mouth. 1tsp 3 times daily  . RESVERATROL PO Take 700 mg by mouth. 2 tablets Daily  . tretinoin (RETIN-A) 0.025 % cream Apply 1 application topically every evening.  . zinc gluconate 50 MG tablet Take 50 mg by mouth daily. 1 Daily   No current facility-administered medications on file prior to visit.     Allergies:   Amoxicillin, Erythromycin, and Biaxin [clarithromycin]   Social History   Tobacco Use  . Smoking status: Never Smoker  . Smokeless tobacco: Never Used  Substance Use Topics  . Alcohol use: Yes    Alcohol/week: 0.0 standard drinks    Comment: occ wine  . Drug use: No    Family History: family history includes Hypertension in her father; Other in her mother and sister; Prostate cancer in her father. There is no history of Colon cancer, Diabetes, Heart disease, Kidney disease, or Liver disease.  ROS:   Please see the history of present illness.  Additional pertinent ROS: Constitutional: Negative for chills, fever, night sweats, unintentional weight loss  HENT: Negative for ear pain and hearing loss.   Eyes: Negative for loss of vision and eye pain.  Respiratory: Negative for cough, sputum, wheezing.   Cardiovascular: See HPI. Gastrointestinal: Negative for abdominal pain, melena, and hematochezia.  Genitourinary: Negative for dysuria and hematuria.  Musculoskeletal: Negative for falls and myalgias.  Skin: Negative for itching and rash.  Neurological: Negative for focal weakness,  focal sensory changes and loss of consciousness.  Endo/Heme/Allergies: Does not bruise/bleed easily.     EKGs/Labs/Other Studies Reviewed:    The following studies were reviewed today: None available  EKG:  EKG is personally reviewed.  The ekg ordered today demonstrates NSR, nonspecific t wave flattening, HR 68  Recent Labs: No results found for requested labs within last 8760 hours.  Recent Lipid Panel No results found for: CHOL, TRIG, HDL, CHOLHDL, VLDL, LDLCALC, LDLDIRECT  Physical Exam:    VS:  BP 130/82   Pulse 68   Temp (!) 97 F (36.1 C)   Ht 5\' 8"  (1.727 m)   Wt 160 lb (72.6 kg)   LMP 07/06/1997   SpO2 99%   BMI 24.33 kg/m     Wt Readings from Last 3 Encounters:  02/16/20 160 lb (72.6 kg)  03/01/19 161 lb (73 kg)  02/25/18 161 lb (73 kg)    GEN: Well nourished, well developed in no acute distress HEENT: Normal, moist mucous membranes NECK: No JVD CARDIAC: regular rhythm, normal S1 and S2, no rubs or gallops.  No murmurs. VASCULAR: Radial and DP pulses 2+ bilaterally. No carotid bruits RESPIRATORY:  Clear to auscultation without rales, wheezing or rhonchi  ABDOMEN: Soft, non-tender, non-distended MUSCULOSKELETAL:  Ambulates independently SKIN: Warm and dry, no edema NEUROLOGIC:  Alert and oriented x 3. No focal neuro deficits noted. PSYCHIATRIC:  Normal affect    ASSESSMENT:    1. Unstable angina (HCC)   2. Palpitations   3. Essential hypertension   4. Cardiac risk counseling   5. Counseling on health promotion and disease prevention   6. Pre-procedure lab exam   7. Mixed hyperlipidemia    PLAN:    Chest discomfort: very concerning symptoms, suggestive of accelerating/unstable angina -discussed options for management. After shared decision making, will pursue cath ASAP -Risks and benefits of cardiac catheterization have been discussed with the patient.  These include bleeding, infection, kidney damage, stroke, heart attack, death.  The patient  understands these risks and is willing to proceed. -strongly counseled on red flag warning signs that need a call to 911 -discussed aspirin, statin, beta blocker. She would like to know if this is her heart first. Strongly recommend at least aspirin 81 mg. Would stop meloxicam -labs today for cath  Palpitations/tachycardia: -2 week Zio -echo  Hypertension: at goal today -continue losartan  Mixed hyperlipidemia: -per KPN, Tchol 252, HDL 55, LDL 165, TG 162 -recommended statin, she would like to have cath first  Cardiac risk counseling and prevention recommendations: -recommend heart healthy/Mediterranean diet, with whole grains, fruits, vegetable, fish, lean meats, nuts, and olive oil. Limit salt. -recommend moderate walking, 3-5 times/week for 30-50 minutes each session. Aim for at least 150 minutes.week. Goal should be pace of 3 miles/hours, or walking 1.5 miles in 30 minutes -recommend avoidance of tobacco products. Avoid excess alcohol.   Plan for follow up: 3 weeks  Buford Dresser, MD, PhD [place]  [facility]    Medication Adjustments/Labs and Tests Ordered: Current medicines are reviewed at length with the patient today.  Concerns regarding medicines are outlined above.  Orders Placed This Encounter  Procedures  . Basic metabolic panel  . CBC  . LONG TERM MONITOR (3-14 DAYS)  . EKG 12-Lead  . ECHOCARDIOGRAM COMPLETE   No orders of the defined types were placed in this encounter.   Patient Instructions  Medication Instructions:  Your Physician recommend you continue on your current medication as directed.    *If you need a refill on your cardiac medications before your next appointment, please call your pharmacy*   Lab Work: Your physician recommends that you return for lab work today ( CBC, BMP)  If you have labs (blood work) drawn today and your tests are completely normal, you will receive your results only by: Marland Kitchen MyChart Message (if you have  MyChart) OR . A paper copy in the mail If you have any lab test that is abnormal or we need to change your treatment, we will call you to review the results.   Testing/Procedures: Your physician has requested that you have a cardiac catheterization. Cardiac catheterization is used to diagnose and/or treat various heart conditions. Doctors may recommend this procedure for a number of different reasons. The most common reason is to evaluate chest pain. Chest pain can be a symptom of coronary artery disease (CAD), and cardiac catheterization can show whether plaque is narrowing or blocking your heart's arteries. This procedure is also used to evaluate the valves, as well as measure the blood flow and oxygen levels in different parts of  your heart. For further information please visit [email or web address]. Please follow instruction sheet, as given. [place] will need to have the coronavirus test completed prior to your procedure. An appointment has been made at 11:35 am on 8/13. This is a Drive Up Visit at [address], [place], [place] [address]. Please tell them that you are there for procedure testing. Stay in your car and someone will be with you shortly. Please make sure to have all other labs completed before this test because you will need to stay quarantined until your procedure.  Your physician has requested that you have an echocardiogram. Echocardiography is a painless test that uses sound waves to create images of your heart. It provides your doctor with information about the size and shape of your heart and how well your heart's chambers and valves are working. This procedure takes approximately one hour. There are no restrictions for this procedure. [place] [address]  Our physician has recommended that you wear an 14  DAY ZIO-PATCH monitor. The Zio patch cardiac monitor continuously records heart rhythm data for up to 14 days, this is for patients being  evaluated for multiple types heart rhythms. For the first 24 hours post application, please avoid getting the Zio monitor wet in the shower or by excessive sweating during exercise. After that, feel free to carry on with regular activities. Keep soaps and lotions away from the ZIO XT Patch.   Someone from our office will call to mail monitor.         Follow-Up: At [facility], you and your health needs are our priority.  As part of our continuing mission to provide you with exceptional heart care, we have created designated Provider Care Teams.  These Care Teams include your primary Cardiologist (physician) and Advanced Practice Providers (APPs -  Physician Assistants and Nurse Practitioners) who all work together to provide you with the care you need, when you need it.  We recommend signing up for the patient portal called "MyChart".  Sign up information is provided on this After Visit Summary.  MyChart is used to connect with patients for Virtual Visits (Telemedicine).  Patients are able to view lab/test results, encounter notes, upcoming appointments, etc.  Non-urgent messages can be sent to your provider as well.   To learn more about what you can do with MyChart, go to [email or web address].    Your next appointment:   3 week(s)  The format for your next appointment:   In Person  Provider:   Buford Dresser, MD     [address] [place] [address] Dept: [phone] Loc: [place]  02/16/2020  You are scheduled for a Cardiac Catheterization on Tuesday, August 17 with Dr. Peter Martinique.  1. Please arrive at the [facility] (Main Entrance A) at [facility]: [address] [place], [place] [address] at 7:00 AM (This time is two hours before your procedure to ensure your preparation). Free valet parking service is available.   Special  note: Every effort is made to have your procedure done on time. Please understand that emergencies sometimes delay scheduled procedures.  2. Diet: Do not eat solid foods after midnight.  The patient may have clear liquids until 5am upon the day of the procedure.  3. Labs: You will need to have blood drawn today ( CBC, BMP)  4. Medication instructions in preparation  for your procedure:   Contrast Allergy: No   On the morning of your procedure, take your Aspirin and any morning medicines NOT listed above.  You may use sips of water.  5. Plan for one night stay--bring personal belongings. 6. Bring a current list of your medications and current insurance cards. 7. You MUST have a responsible person to drive you home. 8. Someone MUST be with you the first 24 hours after you arrive home or your discharge will be delayed. 9. Please wear clothes that are easy to get on and off and wear slip-on shoes.  Thank you for allowing [place] to care for you!   -- [place] [facility]  ZIO XT- Long Term Monitor Instructions   Your physician has requested you wear your ZIO patch monitor__14_____days.   This is a single patch monitor.  [employer] supplies one patch monitor per enrollment.  Additional stickers are not available.   Please do not apply patch if you will be having a Nuclear Stress Test, Echocardiogram, Cardiac CT, MRI, or Chest Xray during the time frame you would be wearing the monitor. The patch cannot be worn during these tests.  You cannot remove and re-apply the ZIO XT patch monitor.   Your ZIO patch monitor will be sent USPS Priority mail from [facility] directly to your home address. The monitor may also be mailed to a PO BOX if home delivery is not available.   It may take 3-5 days to receive your monitor after you have been enrolled.   Once you have received you monitor, please review enclosed instructions.  Your monitor has already been registered assigning  a specific monitor serial # to you.   Applying the monitor   Shave hair from upper left chest.   Hold abrader disc by orange tab.  Rub abrader in 40 strokes over left upper chest as indicated in your monitor instructions.   Clean area with 4 enclosed alcohol pads .  Use all pads to assure are is cleaned thoroughly.  Let dry.   Apply patch as indicated in monitor instructions.  Patch will be place under collarbone on left side of chest with arrow pointing upward.   Rub patch adhesive wings for 2 minutes.Remove white label marked "1".  Remove white label marked "2".  Rub patch adhesive wings for 2 additional minutes.   While looking in a mirror, press and release button in center of patch.  A small green light will flash 3-4 times .  This will be your only indicator the monitor has been turned on.     Do not shower for the first 24 hours.  You may shower after the first 24 hours.   Press button if you feel a symptom. You will hear a small click.  Record Date, Time and Symptom in the Patient Log Book.   When you are ready to remove patch, follow instructions on last 2 pages of Patient Log Book.  Stick patch monitor onto last page of Patient Log Book.   Place Patient Log Book in [place] box.  Use locking tab on box and tape box closed securely.  The [employer] has [employer] on it.  Please place in mailbox as soon as possible.  Your physician should have your test results approximately 7 days after the monitor has been mailed back to [facility].   Call [place] at [phone] if you have questions regarding your ZIO XT patch monitor.  Call them immediately if you see an orange light blinking on your monitor.   If your monitor falls off in less than 4 days contact our Monitor department at [phone].  If your monitor becomes loose or falls off after 4 days call [employer] at [phone] for suggestions on securing your monitor.     Signed, Buford Dresser, MD PhD 02/16/2020  [place]

## 2020-02-19 ENCOUNTER — Telehealth: Payer: Self-pay | Admitting: Cardiovascular Disease

## 2020-02-19 ENCOUNTER — Telehealth: Payer: Self-pay | Admitting: *Deleted

## 2020-02-19 NOTE — Telephone Encounter (Signed)
Follow up: ° ° °Patient returning your call back. Please call patient back. °

## 2020-02-19 NOTE — Telephone Encounter (Signed)
Pt updated with lab results and verbalized understanding.  

## 2020-02-19 NOTE — Telephone Encounter (Signed)
Pt contacted pre-catheterization scheduled at [facility] for: Tuesday February 20, 2020 9 AM Verified arrival time and place: [facility]) at: 7 AM   No solid food after midnight prior to cath, clear liquids until 5 AM day of procedure.   AM meds can be  taken pre-cath with sips of water including: ASA 81 mg   Confirmed patient has responsible adult to drive home post procedure and observe 24 hours after arriving home: yes  You are allowed ONE visitor in the waiting room during your procedure. Both you and your visitor must wear a mask once you enter the hospital.       COVID-19 Pre-Screening Questions:   In the past 7 to 10 days have you had a new cough, shortness of breath, headache, congestion, fever (100 or greater) unexplained body aches, new sore throat, or sudden loss of taste or sense of smell? no  In the past 7 to 10 days have you been around anyone with known Covid 19? no  Have you been vaccinated for COVID-19? Yes, see immunization history  Reviewed procedure/mask/visitor instructions, COVID-19 questions with patient.

## 2020-02-20 ENCOUNTER — Ambulatory Visit (HOSPITAL_COMMUNITY)
Admission: RE | Admit: 2020-02-20 | Discharge: 2020-02-20 | Disposition: A | Discharge status: Home / Self Care | Payer: Medicare Other | Attending: Cardiology | Admitting: Cardiology

## 2020-02-20 ENCOUNTER — Encounter: Payer: Self-pay | Admitting: Cardiology

## 2020-02-20 ENCOUNTER — Other Ambulatory Visit: Payer: Self-pay

## 2020-02-20 ENCOUNTER — Encounter (HOSPITAL_COMMUNITY)
Admission: RE | Disposition: A | Discharge status: Home / Self Care | Payer: Self-pay | Source: Home / Self Care | Attending: Cardiology

## 2020-02-20 DIAGNOSIS — E039 Hypothyroidism, unspecified: Secondary | ICD-10-CM | POA: Diagnosis not present

## 2020-02-20 DIAGNOSIS — M810 Age-related osteoporosis without current pathological fracture: Secondary | ICD-10-CM | POA: Diagnosis not present

## 2020-02-20 DIAGNOSIS — I25110 Atherosclerotic heart disease of native coronary artery with unstable angina pectoris: Secondary | ICD-10-CM | POA: Diagnosis not present

## 2020-02-20 DIAGNOSIS — Z79899 Other long term (current) drug therapy: Secondary | ICD-10-CM | POA: Diagnosis not present

## 2020-02-20 DIAGNOSIS — I10 Essential (primary) hypertension: Secondary | ICD-10-CM | POA: Diagnosis not present

## 2020-02-20 DIAGNOSIS — I200 Unstable angina: Secondary | ICD-10-CM | POA: Diagnosis present

## 2020-02-20 DIAGNOSIS — R079 Chest pain, unspecified: Secondary | ICD-10-CM | POA: Diagnosis present

## 2020-02-20 DIAGNOSIS — I2510 Atherosclerotic heart disease of native coronary artery without angina pectoris: Secondary | ICD-10-CM | POA: Diagnosis not present

## 2020-02-20 DIAGNOSIS — E785 Hyperlipidemia, unspecified: Secondary | ICD-10-CM | POA: Diagnosis present

## 2020-02-20 DIAGNOSIS — E782 Mixed hyperlipidemia: Secondary | ICD-10-CM | POA: Insufficient documentation

## 2020-02-20 DIAGNOSIS — Z79890 Hormone replacement therapy (postmenopausal): Secondary | ICD-10-CM | POA: Insufficient documentation

## 2020-02-20 HISTORY — PX: LEFT HEART CATH AND CORONARY ANGIOGRAPHY: CATH118249

## 2020-02-20 SURGERY — LEFT HEART CATH AND CORONARY ANGIOGRAPHY
Anesthesia: LOCAL

## 2020-02-20 MED ORDER — SODIUM CHLORIDE 0.9% FLUSH: 3.0000 mL | Freq: Two times a day (BID) | INTRAVENOUS | Status: DC

## 2020-02-20 MED ORDER — SODIUM CHLORIDE 0.9% FLUSH: 3.0000 mL | INTRAVENOUS | Status: DC | PRN

## 2020-02-20 MED ORDER — HEPARIN (PORCINE) IN NACL 2000-0.9 UNIT/L-% IV SOLN: INTRAVENOUS | Status: DC | PRN

## 2020-02-20 MED ORDER — LIDOCAINE HCL (PF) 1 % IJ SOLN
INTRAMUSCULAR | Status: AC
  Filled 2020-02-20: qty 30

## 2020-02-20 MED ORDER — FENTANYL CITRATE (PF) 100 MCG/2ML IJ SOLN
INTRAMUSCULAR | Status: AC
  Filled 2020-02-20: qty 2

## 2020-02-20 MED ORDER — MIDAZOLAM HCL 2 MG/2ML IJ SOLN
INTRAMUSCULAR | Status: DC | PRN
  Administered 2020-02-20: 1 mg via INTRAVENOUS

## 2020-02-20 MED ORDER — VERAPAMIL HCL 2.5 MG/ML IV SOLN
INTRAVENOUS | Status: DC | PRN
  Administered 2020-02-20: 10 mL via INTRA_ARTERIAL

## 2020-02-20 MED ORDER — ONDANSETRON HCL 4 MG/2ML IJ SOLN: 4.0000 mg | Freq: Four times a day (QID) | INTRAMUSCULAR | Status: DC | PRN

## 2020-02-20 MED ORDER — ACETAMINOPHEN 325 MG PO TABS: 650.0000 mg | ORAL_TABLET | ORAL | Status: DC | PRN

## 2020-02-20 MED ORDER — FENTANYL CITRATE (PF) 100 MCG/2ML IJ SOLN
INTRAMUSCULAR | Status: DC | PRN
  Administered 2020-02-20: 25 ug via INTRAVENOUS

## 2020-02-20 MED ORDER — MIDAZOLAM HCL 2 MG/2ML IJ SOLN
INTRAMUSCULAR | Status: AC
  Filled 2020-02-20: qty 2

## 2020-02-20 MED ORDER — IOHEXOL 350 MG/ML SOLN
INTRAVENOUS | Status: DC | PRN
  Administered 2020-02-20: 60 mL

## 2020-02-20 MED ORDER — HEPARIN SODIUM (PORCINE) 1000 UNIT/ML IJ SOLN
INTRAMUSCULAR | Status: DC | PRN
  Administered 2020-02-20: 4000 [IU] via INTRAVENOUS

## 2020-02-20 MED ORDER — SODIUM CHLORIDE 0.9 % WEIGHT BASED INFUSION: 1.0000 mL/kg/h | INTRAVENOUS | Status: AC

## 2020-02-20 MED ORDER — SODIUM CHLORIDE 0.9 % IV SOLN: 250.0000 mL | INTRAVENOUS | Status: DC | PRN

## 2020-02-20 MED ORDER — VERAPAMIL HCL 2.5 MG/ML IV SOLN
INTRAVENOUS | Status: AC
  Filled 2020-02-20: qty 2

## 2020-02-20 MED ORDER — LIDOCAINE HCL (PF) 1 % IJ SOLN
INTRAMUSCULAR | Status: DC | PRN
  Administered 2020-02-20: 4 mL

## 2020-02-20 MED ORDER — SODIUM CHLORIDE 0.9 % WEIGHT BASED INFUSION
1.0000 mL/kg/h | INTRAVENOUS | Status: DC
  Administered 2020-02-20: 1 mL/kg/h via INTRAVENOUS

## 2020-02-20 MED ORDER — HEPARIN (PORCINE) IN NACL 1000-0.9 UT/500ML-% IV SOLN
INTRAVENOUS | Status: DC | PRN
  Administered 2020-02-20 (×2): 500 mL

## 2020-02-20 MED ORDER — HEPARIN (PORCINE) IN NACL 1000-0.9 UT/500ML-% IV SOLN
INTRAVENOUS | Status: AC
  Filled 2020-02-20: qty 1000

## 2020-02-20 MED ORDER — HEPARIN SODIUM (PORCINE) 1000 UNIT/ML IJ SOLN
INTRAMUSCULAR | Status: AC
  Filled 2020-02-20: qty 1

## 2020-02-20 MED ORDER — ASPIRIN 81 MG PO CHEW: 81.0000 mg | CHEWABLE_TABLET | ORAL | Status: DC

## 2020-02-20 MED ORDER — SODIUM CHLORIDE 0.9 % WEIGHT BASED INFUSION
3.0000 mL/kg/h | INTRAVENOUS | Status: AC
  Administered 2020-02-20: 3 mL/kg/h via INTRAVENOUS

## 2020-02-20 SURGICAL SUPPLY — 10 items
CATH 5FR JL3.5 JR4 ANG PIG MP (CATHETERS) ×2 IMPLANT
DEVICE RAD COMP TR BAND LRG (VASCULAR PRODUCTS) ×2 IMPLANT
GLIDESHEATH SLEND SS 6F .021 (SHEATH) ×2 IMPLANT
GUIDEWIRE INQWIRE 1.5J.035X260 (WIRE) ×1 IMPLANT
INQWIRE 1.5J .035X260CM (WIRE) ×2
KIT HEART LEFT (KITS) ×2 IMPLANT
PACK CARDIAC CATHETERIZATION (CUSTOM PROCEDURE TRAY) ×2 IMPLANT
SHEATH PROBE COVER 6X72 (BAG) ×2 IMPLANT
TRANSDUCER W/STOPCOCK (MISCELLANEOUS) ×2 IMPLANT
TUBING CIL FLEX 10 FLL-RA (TUBING) ×2 IMPLANT

## 2020-02-20 NOTE — Interval H&P Note (Signed)
History and Physical Interval Note:  02/20/2020 8:30 AM  Rolland Bimler Lanes  has presented today for surgery, with the diagnosis of chest pain.  The various methods of treatment have been discussed with the patient and family. After consideration of risks, benefits and other options for treatment, the patient has consented to  Procedure(s): LEFT HEART CATH AND CORONARY ANGIOGRAPHY (N/A) as a surgical intervention.  The patient's history has been reviewed, patient examined, no change in status, stable for surgery.  I have reviewed the patient's chart and labs.  Questions were answered to the patient's satisfaction.    Cath Lab Visit (complete for each Cath Lab visit)  Clinical Evaluation Leading to the Procedure:   ACS: Yes.    Non-ACS:    Anginal Classification: CCS III  Anti-ischemic medical therapy: No Therapy  Non-Invasive Test Results: No non-invasive testing performed  Prior CABG: No previous CABG       Collier [facility]ina [facility] 02/20/2020 8:31 AM

## 2020-02-20 NOTE — Discharge Instructions (Signed)
Drink plenty of fluids for 48 hours and keep wrist elevated at heart level for 24 hours  Radial Site Care   This sheet gives you information about how to care for yourself after your procedure. Your health care provider may also give you more specific instructions. If you have problems or questions, contact your health care provider. What can I expect after the procedure? After the procedure, it is common to have:  Bruising and tenderness at the catheter insertion area. Follow these instructions at home: Medicines  Take over-the-counter and prescription medicines only as told by your health care provider. Insertion site care 1. Follow instructions from your health care provider about how to take care of your insertion site. Make sure you: ? Wash your hands with soap and water before you change your bandage (dressing). If soap and water are not available, use hand sanitizer. ? Remove your dressing as told by your health care provider. In 24 hours 2. Check your insertion site every day for signs of infection. Check for: ? Redness, swelling, or pain. ? Fluid or blood. ? Pus or a bad smell. ? Warmth. 3. Do not take baths, swim, or use a hot tub until your health care provider approves. 4. You may shower 24-48 hours after the procedure, or as directed by your health care provider. ? Remove the dressing and gently wash the site with plain soap and water. ? Pat the area dry with a clean towel. ? Do not rub the site. That could cause bleeding. 5. Do not apply powder or lotion to the site. Activity   1. For 24 hours after the procedure, or as directed by your health care provider: ? Do not flex or bend the affected arm. ? Do not push or pull heavy objects with the affected arm. ? Do not drive yourself home from the hospital or clinic. You may drive 24 hours after the procedure unless your health care provider tells you not to. ? Do not operate machinery or power tools. 2. Do not lift  anything that is heavier than 10 lb (4.5 kg), or the limit that you are told, until your health care provider says that it is safe.  For 4 days 3. Ask your health care provider when it is okay to: ? Return to work or school. ? Resume usual physical activities or sports. ? Resume sexual activity. General instructions  If the catheter site starts to bleed, raise your arm and put firm pressure on the site. If the bleeding does not stop, get help right away. This is a medical emergency.  If you went home on the same day as your procedure, a responsible adult should be with you for the first 24 hours after you arrive home.  Keep all follow-up visits as told by your health care provider. This is important. Contact a health care provider if:  You have a fever.  You have redness, swelling, or yellow drainage around your insertion site. Get help right away if:  You have unusual pain at the radial site.  The catheter insertion area swells very fast.  The insertion area is bleeding, and the bleeding does not stop when you hold steady pressure on the area.  Your arm or hand becomes pale, cool, tingly, or numb. These symptoms may represent a serious problem that is an emergency. Do not wait to see if the symptoms will go away. Get medical help right away. Call your local emergency services (911 in the [place] Do   not drive yourself to the hospital. Summary  After the procedure, it is common to have bruising and tenderness at the site.  Follow instructions from your health care provider about how to take care of your radial site wound. Check the wound every day for signs of infection.  Do not lift anything that is heavier than 10 lb (4.5 kg), or the limit that you are told, until your health care provider says that it is safe. This information is not intended to replace advice given to you by your health care provider. Make sure you discuss any questions you have with your health care  provider. Document Revised: 07/28/2017 Document Reviewed: 07/28/2017 Elsevier Patient Education  2020 [employer]vier Inc.  

## 2020-02-20 NOTE — Progress Notes (Signed)
Right radial bleeding, 6cc air inserted, hemostasis obtained, 2cc removed with site unremarkable,  Will continue to monitor

## 2020-02-21 ENCOUNTER — Telehealth: Payer: Self-pay | Admitting: Cardiology

## 2020-02-21 ENCOUNTER — Encounter (HOSPITAL_COMMUNITY): Payer: Self-pay | Admitting: Cardiology

## 2020-02-21 NOTE — Telephone Encounter (Signed)
Patient called and wanted to know if she should put on her monitor now or wait until after her Echocardiogram to apply the monitor. Please advise

## 2020-02-21 NOTE — Telephone Encounter (Signed)
Pt just received her 2 week Zio monitor but has her Echo scheduled on 8/24. She is hoping to move her Echo up in order to start wearing her monitor sooner than 8/24. Will reach out to Echo scheduling to see if that is possible.

## 2020-02-27 ENCOUNTER — Other Ambulatory Visit: Payer: Self-pay

## 2020-02-27 ENCOUNTER — Ambulatory Visit (HOSPITAL_COMMUNITY): Payer: Medicare Other | Attending: Cardiology

## 2020-02-27 DIAGNOSIS — I200 Unstable angina: Secondary | ICD-10-CM | POA: Diagnosis not present

## 2020-02-27 LAB — ECHOCARDIOGRAM COMPLETE
Area-P 1/2: 2.32 cm2
S' Lateral: 2.9 cm

## 2020-02-28 ENCOUNTER — Other Ambulatory Visit (INDEPENDENT_AMBULATORY_CARE_PROVIDER_SITE_OTHER): Payer: Medicare Other

## 2020-02-28 DIAGNOSIS — R002 Palpitations: Secondary | ICD-10-CM

## 2020-03-04 ENCOUNTER — Ambulatory Visit: Payer: Medicare Other | Admitting: Certified Nurse Midwife

## 2020-03-08 ENCOUNTER — Ambulatory Visit: Payer: Medicare Other | Admitting: Cardiology

## 2020-03-26 ENCOUNTER — Ambulatory Visit (INDEPENDENT_AMBULATORY_CARE_PROVIDER_SITE_OTHER): Payer: Medicare Other | Admitting: Cardiology

## 2020-03-26 ENCOUNTER — Other Ambulatory Visit: Payer: Self-pay

## 2020-03-26 ENCOUNTER — Encounter: Payer: Self-pay | Admitting: Cardiology

## 2020-03-26 VITALS — BP 115/70 | HR 70 | Temp 95.7°F | Ht 68.0 in | Wt 162.8 lb

## 2020-03-26 DIAGNOSIS — I2510 Atherosclerotic heart disease of native coronary artery without angina pectoris: Secondary | ICD-10-CM | POA: Diagnosis not present

## 2020-03-26 DIAGNOSIS — R002 Palpitations: Secondary | ICD-10-CM | POA: Diagnosis not present

## 2020-03-26 DIAGNOSIS — Z712 Person consulting for explanation of examination or test findings: Secondary | ICD-10-CM

## 2020-03-26 DIAGNOSIS — I200 Unstable angina: Secondary | ICD-10-CM | POA: Diagnosis not present

## 2020-03-26 DIAGNOSIS — E782 Mixed hyperlipidemia: Secondary | ICD-10-CM

## 2020-03-26 DIAGNOSIS — I10 Essential (primary) hypertension: Secondary | ICD-10-CM | POA: Diagnosis not present

## 2020-03-26 DIAGNOSIS — Z7189 Other specified counseling: Secondary | ICD-10-CM

## 2020-03-26 NOTE — Progress Notes (Signed)
Cardiology Office Note:    Date:  03/26/2020   ID:  Natalie Schultz, Natalie Schultz 10-12-1955, MRN [id number]  PCP:  Leanna Battles, MD  Cardiologist:  Buford Dresser, MD  Referring MD: Leanna Battles, MD   CC: follow up  History of Present Illness:    Natalie Schultz is a 64 y.o. female with a hx of palpitations who is seen for follow up today. I initially met her 02/16/20 as a new consult at the request of Leanna Battles, MD for the evaluation and management of chest pain.  Cardiac history: noted highly concerning chest pain symptoms on initial consultation. Risk factors of hypothyroidism, hypertension, hyperlipidemia. Had cath with nonobstructive disease. Echo and monitor as below.  Today: Reviewed cath, echo, and monitor at length today. Discussed PACs, PVCs, and SVT in detail. Discussed watchful waiting vs. Preventative medication.   Has given up caffeine, some improvement with this. Reassured by cath.   Denies shortness of breath at rest or with normal exertion. No PND, orthopnea, LE edema or unexpected weight gain. No syncope.  Past Medical History:  Diagnosis Date  . Arthritis   . Bell's palsy 07/2016  . Bladder stone   . History of syncope    S/P CARDIAC ABLATION OF ARRYTHMIA  . Hypothyroidism   . Osteoporosis   . Personal history of colonic adenomas 04/11/2013  . S/P ablation operation for arrhythmia    1998 BY DR Caryl Comes--  NO ISSUES SINCE    Past Surgical History:  Procedure Laterality Date  . CARDIAC CATHETERIZATION  2002   with ablation  . CARDIAC ELECTROPHYSIOLOGY STUDY AND ABLATION  1998 (APPROX)  DR Caryl Comes   ARRHYTHMIA (PT UNSURE TYPE BUT HAD SYNCOPAL EPISODES)  PER PT NO S &S SINCE  . COLONOSCOPY W/ POLYPECTOMY  04/07/2013  . CYSTOSCOPY WITH BIOPSY N/A 02/21/2013   Procedure: CYSTOSCOPY WITH BIOPSY  Transurethral laser of bladder stone and foreign body;  Surgeon: Reece Packer, MD;  Location: [facility];  Service: Urology;  Laterality:  N/A;  60 mins req for this case   . HOLMIUM LASER APPLICATION N/A 10/01/9240   Procedure: HOLMIUM LASER APPLICATION;  Surgeon: Reece Packer, MD;  Location: [facility];  Service: Urology;  Laterality: N/A;  . LAPAROSCOPIC ASSISTED VAGINAL HYSTERECTOMY  1999   W/ RIGHT SALPINGOOPHORECTOMY AND BLADDER TACK SUSPENSION  . LEFT HEART CATH AND CORONARY ANGIOGRAPHY N/A 02/20/2020   Procedure: LEFT HEART CATH AND CORONARY ANGIOGRAPHY;  Surgeon: Martinique, Peter M, MD;  Location: [facility];  Service: Cardiovascular;  Laterality: N/A;    Current Medications: Current Outpatient Medications on File Prior to Visit  Medication Sig  . acetaminophen (TYLENOL) 650 MG CR tablet Take 1,300 mg by mouth at bedtime.   . Calcium Citrate 200 MG TABS Take 400 mg by mouth daily.  . Cholecalciferol (VITAMIN D-3) 125 MCG (5000 UT) TABS Take 15,000 Units by mouth daily.  . diphenhydrAMINE (BENADRYL) 25 mg capsule Take 25 mg by mouth at bedtime as needed for allergies.  Marland Kitchen docusate sodium (COLACE) 100 MG capsule Take 200 mg by mouth at bedtime.  Marland Kitchen levothyroxine (SYNTHROID, LEVOTHROID) 175 MCG tablet Take 175 mcg by mouth daily before breakfast.   . loratadine (CLARITIN) 10 MG tablet Take 10 mg by mouth daily.  Marland Kitchen losartan (COZAAR) 50 MG tablet Take 50 mg by mouth every evening.  . meloxicam (MOBIC) 15 MG tablet Take 1 tablet (15 mg total) by mouth daily.  . metroNIDAZOLE (METROCREAM) 0.75 % cream Apply 1  application topically daily. For rosacea   . Multiple Vitamin (MULTI-VITAMIN DAILY PO) Take 1 tablet by mouth daily. Vita Pack  . omeprazole (PRILOSEC) 20 MG capsule Take 20 mg by mouth daily.  Marland Kitchen OVER THE COUNTER MEDICATION Take 1 capsule by mouth daily. "Instaflex Advance" joint support supplement  . OVER THE COUNTER MEDICATION Take 50 mg by mouth at bedtime.  . Psyllium (METAMUCIL PO) Take 5 mLs by mouth daily as needed (constipation).   . RESVERATROL PO Take 1,450 mg by mouth daily. 725 mg per tablet  .  tretinoin (RETIN-A) 0.1 % cream Apply 1 application topically every evening.   . Triamcinolone Acetonide (NASACORT ALLERGY 24HR NA) Place 2 sprays into the nose daily.  Marland Kitchen zinc gluconate 50 MG tablet Take 50 mg by mouth daily. 1 Daily   No current facility-administered medications on file prior to visit.     Allergies:   Amoxicillin, Biaxin [clarithromycin], and Erythromycin   Social History   Tobacco Use  . Smoking status: Never Smoker  . Smokeless tobacco: Never Used  Substance Use Topics  . Alcohol use: Yes    Alcohol/week: 0.0 standard drinks    Comment: occ wine  . Drug use: No    Family History: family history includes Hypertension in her father; Other in her mother and sister; Prostate cancer in her father. There is no history of Colon cancer, Diabetes, Heart disease, Kidney disease, or Liver disease.  ROS:   Please see the history of present illness.  Additional pertinent ROS otherwise unremarkable.     EKGs/Labs/Other Studies Reviewed:    The following studies were reviewed today: Cath 02/20/20  Prox LAD to Mid LAD lesion is 25% stenosed.  Mid LAD lesion is 30% stenosed.  Prox RCA lesion is 15% stenosed.  The left ventricular systolic function is normal.  LV end diastolic pressure is normal.  The left ventricular ejection fraction is 55-65% by visual estimate.   1. Nonobstructive CAD 2. Normal LV function 3. Normal LVEDP  Plan: risk factor modification.  Echo 02/27/20 1. Left ventricular ejection fraction, by estimation, is 55 to 60%. The  left ventricle has normal function. The left ventricle has no regional  wall motion abnormalities. There is mild left ventricular hypertrophy.  Left ventricular diastolic parameters  are consistent with Grade I diastolic dysfunction (impaired relaxation).  2. Right ventricular systolic function is normal. The right ventricular  size is normal. There is normal pulmonary artery systolic pressure. The  estimated right  ventricular systolic pressure is 93.7 mmHg.  3. The mitral valve is normal in structure. No evidence of mitral valve  regurgitation. No evidence of mitral stenosis.  4. The aortic valve is tricuspid. Aortic valve regurgitation is not  visualized. No aortic stenosis is present.  5. Aortic dilatation noted. 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  6. The inferior vena cava is normal in size with greater than 50%  respiratory variability, suggesting right atrial pressure of 3 mmHg.   Monitor 03/20/20 13 days of data recorded on Zio monitor. Patient had a min HR of 49 bpm, max HR of 174 bpm, and avg HR of 74 bpm. Predominant underlying rhythm was Sinus Rhythm. No VT, atrial fibrillation, high degree block, or pauses noted. There were 26 episodes of brief SVT, the run with the fastest interval lasting 6 beats with a max rate of 174 bpm, the longest lasting 20 beats with an avg rate of 139 bpm. Isolated atrial ectopy was  occasional, and isolated ventricular ectopy was rare. There were 94 triggered events.   EKG:  EKG is personally reviewed.  The ekg ordered 02/16/20 demonstrates NSR, nonspecific t wave flattening, HR 68  Recent Labs: 02/16/2020: BUN 20; Creatinine, Ser 0.60; Hemoglobin 14.4; Platelets 174; Potassium 4.3; Sodium 142  Recent Lipid Panel No results found for: CHOL, TRIG, HDL, CHOLHDL, VLDL, LDLCALC, LDLDIRECT  Physical Exam:    VS:  BP 115/70   Pulse 70   Temp (!) 95.7 F (35.4 C)   Ht 5' 8"  (1.727 m)   Wt 162 lb 12.8 oz (73.8 kg)   LMP 07/06/1997   SpO2 94%   BMI 24.75 kg/m     Wt Readings from Last 3 Encounters:  03/26/20 162 lb 12.8 oz (73.8 kg)  02/20/20 158 lb (71.7 kg)  02/16/20 160 lb (72.6 kg)    GEN: Well nourished, well developed in no acute distress HEENT: Normal, moist mucous membranes NECK: No JVD CARDIAC: regular rhythm, normal S1 and S2, no rubs or gallops. No murmur. VASCULAR: Radial and DP pulses 2+ bilaterally. No carotid  bruits RESPIRATORY:  Clear to auscultation without rales, wheezing or rhonchi  ABDOMEN: Soft, non-tender, non-distended MUSCULOSKELETAL:  Ambulates independently SKIN: Warm and dry, no edema NEUROLOGIC:  Alert and oriented x 3. No focal neuro deficits noted. PSYCHIATRIC:  Normal affect   ASSESSMENT:    1. Heart palpitations   2. Nonocclusive coronary atherosclerosis of native coronary artery   3. Essential hypertension   4. Mixed hyperlipidemia   5. Cardiac risk counseling   6. Counseling on health promotion and disease prevention   7. Encounter to discuss test results    PLAN:    Chest discomfort, Palpitations/tachycardia -cath, echo, Zio reviewed today -discussed options for management of brief/paroxysmal SVT. She would like to avoid medications. Discussed vagal maneuvers -discussed red flag warning signs that need immediate medical attention  nonobstructive CAD -discussed recommendations for aspirin, statin today  Hypertension: at goal today -continue losartan  Mixed hyperlipidemia: -per KPN, Tchol 252, HDL 55, LDL 165, TG 162 -recommended statin, declined -reviewed lifestyle recommendations  Cardiac risk counseling and prevention recommendations: -recommend heart healthy/Mediterranean diet, with whole grains, fruits, vegetable, fish, lean meats, nuts, and olive oil. Limit salt. -recommend moderate walking, 3-5 times/week for 30-50 minutes each session. Aim for at least 150 minutes.week. Goal should be pace of 3 miles/hours, or walking 1.5 miles in 30 minutes -recommend avoidance of tobacco products. Avoid excess alcohol.   Plan for follow up: 1 year or sooner as needed  Buford Dresser, MD, PhD [place]  [facility]    Medication Adjustments/Labs and Tests Ordered: Current medicines are reviewed at length with the patient today.  Concerns regarding medicines are outlined above.  No orders of the defined types were placed in this encounter.  No orders  of the defined types were placed in this encounter.   Patient Instructions  Medication Instructions:  Your Physician recommend you continue on your current medication as directed.    *If you need a refill on your cardiac medications before your next appointment, please call your pharmacy*   Lab Work: None ordered    Testing/Procedures: None ordered    Follow-Up: At [facility], you and your health needs are our priority.  As part of our continuing mission to provide you with exceptional heart care, we have created designated Provider Care Teams.  These Care Teams include your primary Cardiologist (physician) and Advanced Practice Providers (APPs -  Physician Assistants  and Nurse Practitioners) who all work together to provide you with the care you need, when you need it.  We recommend signing up for the patient portal called "MyChart".  Sign up information is provided on this After Visit Summary.  MyChart is used to connect with patients for Virtual Visits (Telemedicine).  Patients are able to view lab/test results, encounter notes, upcoming appointments, etc.  Non-urgent messages can be sent to your provider as well.   To learn more about what you can do with MyChart, go to [email or web address].    Your next appointment:   1 year(s)  The format for your next appointment:   In Person  Provider:   Buford Dresser, MD   Look up vagal maneuvers for breaking SVT.    Signed, Buford Dresser, MD PhD 03/26/2020  [place]

## 2020-03-26 NOTE — Patient Instructions (Addendum)
Medication Instructions:  Your Physician recommend you continue on your current medication as directed.    *If you need a refill on your cardiac medications before your next appointment, please call your pharmacy*   Lab Work: None ordered    Testing/Procedures: None ordered    Follow-Up: At [facility], you and your health needs are our priority.  As part of our continuing mission to provide you with exceptional heart care, we have created designated Provider Care Teams.  These Care Teams include your primary Cardiologist (physician) and Advanced Practice Providers (APPs -  Physician Assistants and Nurse Practitioners) who all work together to provide you with the care you need, when you need it.  We recommend signing up for the patient portal called "MyChart".  Sign up information is provided on this After Visit Summary.  MyChart is used to connect with patients for Virtual Visits (Telemedicine).  Patients are able to view lab/test results, encounter notes, upcoming appointments, etc.  Non-urgent messages can be sent to your provider as well.   To learn more about what you can do with MyChart, go to [email or web address].    Your next appointment:   1 year(s)  The format for your next appointment:   In Person  Provider:   Buford Dresser, MD   Look up vagal maneuvers for breaking SVT.

## 2020-04-08 ENCOUNTER — Other Ambulatory Visit: Payer: Self-pay | Admitting: Obstetrics and Gynecology

## 2020-04-08 DIAGNOSIS — Z1231 Encounter for screening mammogram for malignant neoplasm of breast: Secondary | ICD-10-CM

## 2020-05-01 ENCOUNTER — Telehealth (HOSPITAL_COMMUNITY): Payer: Self-pay

## 2020-05-01 ENCOUNTER — Other Ambulatory Visit: Payer: Self-pay | Admitting: Family

## 2020-05-01 ENCOUNTER — Ambulatory Visit (HOSPITAL_COMMUNITY)
Admission: RE | Admit: 2020-05-01 | Discharge: 2020-05-01 | Disposition: A | Discharge status: Home / Self Care | Payer: Medicare Other | Source: Ambulatory Visit | Attending: Pulmonary Disease | Admitting: Pulmonary Disease

## 2020-05-01 ENCOUNTER — Ambulatory Visit: Payer: Medicare Other

## 2020-05-01 DIAGNOSIS — U071 COVID-19: Secondary | ICD-10-CM

## 2020-05-01 DIAGNOSIS — R54 Age-related physical debility: Secondary | ICD-10-CM | POA: Insufficient documentation

## 2020-05-01 DIAGNOSIS — Z23 Encounter for immunization: Secondary | ICD-10-CM | POA: Diagnosis present

## 2020-05-01 MED ORDER — SODIUM CHLORIDE 0.9 % IV SOLN: INTRAVENOUS | Status: DC | PRN

## 2020-05-01 MED ORDER — EPINEPHRINE 0.3 MG/0.3ML IJ SOAJ: 0.3000 mg | Freq: Once | INTRAMUSCULAR | Status: DC | PRN

## 2020-05-01 MED ORDER — ALBUTEROL SULFATE HFA 108 (90 BASE) MCG/ACT IN AERS: 2.0000 | INHALATION_SPRAY | Freq: Once | RESPIRATORY_TRACT | Status: DC | PRN

## 2020-05-01 MED ORDER — DIPHENHYDRAMINE HCL 50 MG/ML IJ SOLN: 50.0000 mg | Freq: Once | INTRAMUSCULAR | Status: DC | PRN

## 2020-05-01 MED ORDER — METHYLPREDNISOLONE SODIUM SUCC 125 MG IJ SOLR: 125.0000 mg | Freq: Once | INTRAMUSCULAR | Status: DC | PRN

## 2020-05-01 MED ORDER — FAMOTIDINE IN NACL 20-0.9 MG/50ML-% IV SOLN: 20.0000 mg | Freq: Once | INTRAVENOUS | Status: DC | PRN

## 2020-05-01 MED ORDER — SODIUM CHLORIDE 0.9 % IV SOLN: Freq: Once | INTRAVENOUS | Status: AC

## 2020-05-01 NOTE — Progress Notes (Signed)
I connected by phone with Natalie Schultz on 05/01/2020 at 3:13 PM to discuss the potential use of a new treatment for mild to moderate COVID-19 viral infection in non-hospitalized patients.  This patient is a 64 y.o. female that meets the FDA criteria for Emergency Use Authorization of COVID monoclonal antibody casirivimab/imdevimab or bamlanivimab/eteseviamb.  Has a (+) direct SARS-CoV-2 viral test result  Has mild or moderate COVID-19   Is NOT hospitalized due to COVID-19  Is within 10 days of symptom onset  Has at least one of the high risk factor(s) for progression to severe COVID-19 and/or hospitalization as defined in EUA.  Specific high risk criteria : Older age (>/= 65 yo) Symptoms of congestion, cough, and H/A began 04/29/20.  I have spoken and communicated the following to the patient or parent/caregiver regarding COVID monoclonal antibody treatment:  1. FDA has authorized the emergency use for the treatment of mild to moderate COVID-19 in adults and pediatric patients with positive results of direct SARS-CoV-2 viral testing who are 55 years of age and older weighing at least 40 kg, and who are at high risk for progressing to severe COVID-19 and/or hospitalization.  2. The significant known and potential risks and benefits of COVID monoclonal antibody, and the extent to which such potential risks and benefits are unknown.  3. Information on available alternative treatments and the risks and benefits of those alternatives, including clinical trials.  4. Patients treated with COVID monoclonal antibody should continue to self-isolate and use infection control measures (e.g., wear mask, isolate, social distance, avoid sharing personal items, clean and disinfect "high touch" surfaces, and frequent handwashing) according to CDC guidelines.   5. The patient or parent/caregiver has the option to accept or refuse COVID monoclonal antibody treatment.  After reviewing this  information with the patient, the patient has agreed to receive one of the available covid 19 monoclonal antibodies and will be provided an appropriate fact sheet prior to infusion. Natalie Gowda, NP 05/01/2020 3:13 PM

## 2020-05-01 NOTE — Progress Notes (Signed)
  Diagnosis: COVID-19  Physician: Dr. Joya Gaskins  Procedure: [facility]fusion [facility] Med: bamlanivimab\etesevimab infusion - Provided patient with bamlanimivab\etesevimab fact sheet for patients, parents and caregivers prior to infusion.  Complications: No immediate complications noted.  Discharge: Discharged home   Williams Che 05/01/2020

## 2020-05-01 NOTE — Telephone Encounter (Signed)
Called to Discuss with patient about Covid symptoms and the use of the monoclonal antibody infusion for those with mild to moderate Covid symptoms and at a high risk of hospitalization.     Pt appears to qualify for this infusion due to co-morbid conditions and/or a member of an at-risk group in accordance with the FDA Emergency Use Authorization.    Patient interested in treatment.   DAY 2. Pre-screened by RN and ready for APP. Sx onset 10/25. Test 10/27 at [facility]. Sx of congestion. Fully vaccinated with Moderna in [place].

## 2020-05-01 NOTE — Discharge Instructions (Signed)

## 2020-05-27 ENCOUNTER — Other Ambulatory Visit: Payer: Self-pay

## 2020-05-27 ENCOUNTER — Ambulatory Visit
Admission: RE | Admit: 2020-05-27 | Discharge: 2020-05-27 | Disposition: A | Discharge status: Home / Self Care | Payer: Medicare Other | Source: Ambulatory Visit | Attending: Obstetrics and Gynecology | Admitting: Obstetrics and Gynecology

## 2020-05-27 DIAGNOSIS — Z1231 Encounter for screening mammogram for malignant neoplasm of breast: Secondary | ICD-10-CM

## 2020-06-20 ENCOUNTER — Ambulatory Visit: Payer: Medicare Other | Admitting: Obstetrics and Gynecology

## 2021-09-10 ENCOUNTER — Other Ambulatory Visit: Payer: Self-pay | Admitting: Obstetrics and Gynecology

## 2021-09-10 DIAGNOSIS — Z1231 Encounter for screening mammogram for malignant neoplasm of breast: Secondary | ICD-10-CM

## 2021-09-16 ENCOUNTER — Ambulatory Visit
Admission: RE | Admit: 2021-09-16 | Discharge: 2021-09-16 | Disposition: A | Discharge status: Home / Self Care | Payer: Medicare Other | Source: Ambulatory Visit | Attending: Obstetrics and Gynecology | Admitting: Obstetrics and Gynecology

## 2021-09-16 ENCOUNTER — Other Ambulatory Visit: Payer: Self-pay

## 2021-09-16 DIAGNOSIS — Z1231 Encounter for screening mammogram for malignant neoplasm of breast: Secondary | ICD-10-CM

## 2021-11-04 ENCOUNTER — Ambulatory Visit (HOSPITAL_BASED_OUTPATIENT_CLINIC_OR_DEPARTMENT_OTHER): Payer: Medicare Other | Admitting: Cardiology

## 2021-11-21 DIAGNOSIS — M25561 Pain in right knee: Secondary | ICD-10-CM | POA: Insufficient documentation

## 2021-11-26 ENCOUNTER — Ambulatory Visit: Payer: Medicare Other | Admitting: Obstetrics and Gynecology

## 2021-11-28 ENCOUNTER — Ambulatory Visit (HOSPITAL_BASED_OUTPATIENT_CLINIC_OR_DEPARTMENT_OTHER): Payer: Medicare Other | Admitting: Cardiology

## 2021-11-28 VITALS — BP 114/76 | HR 58 | Ht 68.0 in | Wt 162.0 lb

## 2021-11-28 DIAGNOSIS — E782 Mixed hyperlipidemia: Secondary | ICD-10-CM | POA: Diagnosis not present

## 2021-11-28 DIAGNOSIS — Z7189 Other specified counseling: Secondary | ICD-10-CM | POA: Diagnosis not present

## 2021-11-28 DIAGNOSIS — I2510 Atherosclerotic heart disease of native coronary artery without angina pectoris: Secondary | ICD-10-CM | POA: Diagnosis not present

## 2021-11-28 DIAGNOSIS — I10 Essential (primary) hypertension: Secondary | ICD-10-CM | POA: Diagnosis not present

## 2021-11-28 MED ORDER — NEXLETOL 180 MG PO TABS: 180.0000 mg | ORAL_TABLET | Freq: Every day | ORAL | 11 refills | Status: DC

## 2021-11-28 NOTE — Patient Instructions (Signed)
Medication Instructions:  START: Nexletol 180 mg daily. Please call if you are able to tolerate, then we will send a prescription.   *If you need a refill on your cardiac medications before your next appointment, please call your pharmacy*   Lab Work: None ordered today   Testing/Procedures: None ordered today   Follow-Up: At [facility], you and your health needs are our priority.  As part of our continuing mission to provide you with exceptional heart care, we have created designated Provider Care Teams.  These Care Teams include your primary Cardiologist (physician) and Advanced Practice Providers (APPs -  Physician Assistants and Nurse Practitioners) who all work together to provide you with the care you need, when you need it.  We recommend signing up for the patient portal called "MyChart".  Sign up information is provided on this After Visit Summary.  MyChart is used to connect with patients for Virtual Visits (Telemedicine).  Patients are able to view lab/test results, encounter notes, upcoming appointments, etc.  Non-urgent messages can be sent to your provider as well.   To learn more about what you can do with MyChart, go to [email or web address].    Your next appointment:   1 year(s)  The format for your next appointment:   In Person  Provider:   Buford Dresser, MD

## 2021-11-28 NOTE — Progress Notes (Signed)
Cardiology Office Note:    Date:  11/28/2021   ID:  [name], [name] [birth year], MRN [id number]  PCP:  Donnajean Lopes, MD  Cardiologist:  Buford Dresser, MD  Referring MD: Donnajean Lopes, MD   CC: follow up  History of Present Illness:    Natalie Schultz is a 66 y.o. female with a hx of palpitations who is seen for follow up. She was initially seen  02/16/20 as a new consult at the request of Donnajean Lopes, MD for the evaluation and management of chest pain.  Cardiac history: noted highly concerning chest pain symptoms on initial consultation. Risk factors of hypothyroidism, hypertension, hyperlipidemia. Had cath with nonobstructive disease. Echo and monitor as below.  At her last appointment we reviewed cath, echo, and monitor at length. Discussed PACs, PVCs, and SVT in detail. Discussed watchful waiting vs. Preventative medication. She endorsed that she gave up caffeine.  Today: Generally she is doing well aside from a recent injury to her right knee this past Friday. She is following up with orthopedics, and will begin PT soon. Prior to her injury she was working out 6 days a week. She believes she may have caused the injury due to intense workouts such as squats.  She denies any recurrent palpitations or chest pain.  Regarding her diet, she frequently has fish, vegetables, and protein shakes.  She confirms her most recent lab work was in November 2022. Currently she is taking rosuvastatin. When she initially started on a statin she developed flu-like symptoms.  Of note, she believes she is still taking bisoprolol.  She denies any shortness of breath, or peripheral edema. No lightheadedness, headaches, syncope, orthopnea, or PND.   Past Medical History:  Diagnosis Date   Arthritis    Bell's palsy 07/2016   Bladder stone    History of syncope    S/P CARDIAC ABLATION OF ARRYTHMIA   Hypothyroidism    Osteoporosis    Personal history of  colonic adenomas 04/11/2013   S/P ablation operation for arrhythmia    1998 BY DR Caryl Comes--  NO ISSUES SINCE    Past Surgical History:  Procedure Laterality Date   CARDIAC CATHETERIZATION  2002   with ablation   CARDIAC ELECTROPHYSIOLOGY STUDY AND ABLATION  1998 (APPROX)  DR Caryl Comes   ARRHYTHMIA (PT UNSURE TYPE BUT HAD SYNCOPAL EPISODES)  PER PT NO S &S SINCE   COLONOSCOPY W/ POLYPECTOMY  04/07/2013   CYSTOSCOPY WITH BIOPSY N/A 02/21/2013   Procedure: CYSTOSCOPY WITH BIOPSY  Transurethral laser of bladder stone and foreign body;  Surgeon: Reece Packer, MD;  Location: [facility];  Service: Urology;  Laterality: N/A;  60 mins req for this case    HOLMIUM LASER APPLICATION N/A 4/40/3474   Procedure: HOLMIUM LASER APPLICATION;  Surgeon: Reece Packer, MD;  Location: [facility];  Service: Urology;  Laterality: N/A;   LAPAROSCOPIC ASSISTED VAGINAL HYSTERECTOMY  1999   W/ RIGHT SALPINGOOPHORECTOMY AND BLADDER TACK SUSPENSION   LEFT HEART CATH AND CORONARY ANGIOGRAPHY N/A 02/20/2020   Procedure: LEFT HEART CATH AND CORONARY ANGIOGRAPHY;  Surgeon: Martinique, Peter M, MD;  Location: [facility];  Service: Cardiovascular;  Laterality: N/A;    Current Medications: Current Outpatient Medications on File Prior to Visit  Medication Sig   acetaminophen (TYLENOL) 650 MG CR tablet Take 1,300 mg by mouth at bedtime.   Calcium Citrate 200 MG TABS Take 400 mg by mouth daily.   Cholecalciferol (VITAMIN D-3) 125 MCG (5000  UT) TABS Take 15,000 Units by mouth daily.   diphenhydrAMINE (BENADRYL) 25 mg capsule Take 25 mg by mouth at bedtime as needed for allergies.   docusate sodium (COLACE) 100 MG capsule Take 200 mg by mouth at bedtime.   levothyroxine (SYNTHROID, LEVOTHROID) 175 MCG tablet Take 175 mcg by mouth daily before breakfast.    loratadine (CLARITIN) 10 MG tablet Take 10 mg by mouth daily.   losartan (COZAAR) 50 MG tablet Take 50 mg by mouth every evening.   meloxicam (MOBIC) 15 MG tablet Take 1  tablet (15 mg total) by mouth daily.   metroNIDAZOLE (METROCREAM) 0.75 % cream Apply 1 application topically daily. For rosacea    Multiple Vitamin (MULTI-VITAMIN DAILY PO) Take 1 tablet by mouth daily. Vita Pack   omeprazole (PRILOSEC) 20 MG capsule Take 20 mg by mouth daily.   OVER THE COUNTER MEDICATION Take 1 capsule by mouth daily. "Instaflex Advance" joint support supplement   OVER THE COUNTER MEDICATION Take 50 mg by mouth at bedtime.   Psyllium (METAMUCIL PO) Take 5 mLs by mouth daily as needed (constipation).    RESVERATROL PO Take 1,450 mg by mouth daily. 725 mg per tablet   rosuvastatin (CRESTOR) 5 MG tablet Take 1 tablet by mouth daily.   tretinoin (RETIN-A) 0.1 % cream Apply 1 application topically every evening.    Triamcinolone Acetonide (NASACORT ALLERGY 24HR NA) Place 2 sprays into the nose daily.   zinc gluconate 50 MG tablet Take 50 mg by mouth daily. 1 Daily   zolpidem (AMBIEN) 10 MG tablet zolpidem 10 mg tablet  TAKE 1 TABLET BY MOUTH EVERY NIGHT AS NEEDED FOR SLEEP   No current facility-administered medications on file prior to visit.     Allergies:   Amoxicillin, Biaxin [clarithromycin], and Erythromycin   Social History   Tobacco Use   Smoking status: Never   Smokeless tobacco: Never  Substance Use Topics   Alcohol use: Yes    Alcohol/week: 0.0 standard drinks    Comment: occ wine   Drug use: No    Family History: family history includes Hypertension in her father; Other in her mother and sister; Prostate cancer in her father. There is no history of Colon cancer, Diabetes, Heart disease, Kidney disease, Liver disease, or Breast cancer.  ROS:   Please see the history of present illness.    (+) Right knee pain  Additional pertinent ROS otherwise unremarkable.     EKGs/Labs/Other Studies Reviewed:    The following studies were reviewed today:   Cath 02/20/20  Prox LAD to Mid LAD lesion is 25% stenosed. Mid LAD lesion is 30% stenosed. Prox RCA  lesion is 15% stenosed. The left ventricular systolic function is normal. LV end diastolic pressure is normal. The left ventricular ejection fraction is 55-65% by visual estimate.   1. Nonobstructive CAD 2. Normal LV function 3. Normal LVEDP   Plan: risk factor modification.  Echo 02/27/20  1. Left ventricular ejection fraction, by estimation, is 55 to 60%. The  left ventricle has normal function. The left ventricle has no regional  wall motion abnormalities. There is mild left ventricular hypertrophy.  Left ventricular diastolic parameters  are consistent with Grade I diastolic dysfunction (impaired relaxation).   2. Right ventricular systolic function is normal. The right ventricular  size is normal. There is normal pulmonary artery systolic pressure. The  estimated right ventricular systolic pressure is 37.1 mmHg.   3. The mitral valve is normal in structure. No evidence of mitral  valve  regurgitation. No evidence of mitral stenosis.   4. The aortic valve is tricuspid. Aortic valve regurgitation is not  visualized. No aortic stenosis is present.   5. Aortic dilatation noted. 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   6. The inferior vena cava is normal in size with greater than 50%  respiratory variability, suggesting right atrial pressure of 3 mmHg.   Monitor 03/20/20 13 days of data recorded on Zio monitor. Patient had a min HR of 49 bpm, max HR of 174 bpm, and avg HR of 74 bpm. Predominant underlying rhythm was Sinus Rhythm. No VT, atrial fibrillation, high degree block, or pauses noted. There were 26 episodes of brief SVT, the run with the fastest interval lasting 6 beats with a max rate of 174 bpm, the longest lasting 20 beats with an avg rate of 139 bpm. Isolated atrial ectopy was occasional, and isolated ventricular ectopy was rare. There were 94 triggered events.     EKG:  EKG is personally reviewed.    11/28/21: Sinus bradycardia at 58 bpm,  nonspecific st pattern similar to prior 02/16/20: NSR, nonspecific t wave flattening, HR 68  Recent Labs: No results found for requested labs within last 8760 hours.  Recent Lipid Panel No results found for: CHOL, TRIG, HDL, CHOLHDL, VLDL, LDLCALC, LDLDIRECT  Physical Exam:    VS:  BP 114/76 (BP Location: Right Arm, Patient Position: Sitting, Cuff Size: Normal)   Pulse (!) 58   Ht '5\' 8"'$  (1.727 m)   Wt 162 lb (73.5 kg)   LMP 07/06/1997   BMI 24.63 kg/m     Wt Readings from Last 3 Encounters:  11/28/21 162 lb (73.5 kg)  03/26/20 162 lb 12.8 oz (73.8 kg)  02/20/20 158 lb (71.7 kg)    GEN: Well nourished, well developed in no acute distress HEENT: Normal, moist mucous membranes NECK: No JVD CARDIAC: regular rhythm, normal S1 and S2, no rubs or gallops. No murmur. VASCULAR: Radial and DP pulses 2+ bilaterally. No carotid bruits RESPIRATORY:  Clear to auscultation without rales, wheezing or rhonchi  ABDOMEN: Soft, non-tender, non-distended MUSCULOSKELETAL:  Ambulates independently SKIN: Warm and dry, no edema NEUROLOGIC:  Alert and oriented x 3. No focal neuro deficits noted. PSYCHIATRIC:  Normal affect   ASSESSMENT:    1. Nonocclusive coronary atherosclerosis of native coronary artery   2. Mixed hyperlipidemia   3. Essential hypertension   4. Cardiac risk counseling   5. Counseling on health promotion and disease prevention     PLAN:    Chest discomfort, Palpitations/tachycardia -cath, echo, Zio reviewed -discussed options for management of brief/paroxysmal SVT. She would like to avoid medications. Discussed vagal maneuvers -discussed red flag warning signs that need immediate medical attention  nonobstructive CAD -discussed recommendations for aspirin, statin -tolerating rosuvastatin 5 mg, see lipids below  Hypertension: at goal today -continue losartan  Mixed hyperlipidemia: -did not feel well on statin, tolerates 5 mg dose -LDL not at goal. Discussed bempedoic  acid. She is willing to try this. Will start with samples -LDL goal <70 -reviewed lifestyle recommendations  Cardiac risk counseling and prevention recommendations: -recommend heart healthy/Mediterranean diet, with whole grains, fruits, vegetable, fish, lean meats, nuts, and olive oil. Limit salt. -recommend moderate walking, 3-5 times/week for 30-50 minutes each session. Aim for at least 150 minutes.week. Goal should be pace of 3 miles/hours, or walking 1.5 miles in 30 minutes -recommend avoidance of tobacco products. Avoid excess alcohol.   Plan for  follow up: 1 year or sooner as needed   Buford Dresser, MD, PhD, [facility]    Medication Adjustments/Labs and Tests Ordered: Current medicines are reviewed at length with the patient today.  Concerns regarding medicines are outlined above.  Orders Placed This Encounter  Procedures   EKG 12-Lead   Meds ordered this encounter  Medications   Bempedoic Acid (NEXLETOL) 180 MG TABS    Sig: Take 180 mg by mouth daily.    Dispense:  30 tablet    Refill:  11    Patient Instructions  Medication Instructions:  START: Nexletol 180 mg daily. Please call if you are able to tolerate, then we will send a prescription.   *If you need a refill on your cardiac medications before your next appointment, please call your pharmacy*   Lab Work: None ordered today   Testing/Procedures: None ordered today   Follow-Up: At [facility], you and your health needs are our priority.  As part of our continuing mission to provide you with exceptional heart care, we have created designated Provider Care Teams.  These Care Teams include your primary Cardiologist (physician) and Advanced Practice Providers (APPs -  Physician Assistants and Nurse Practitioners) who all work together to provide you with the care you need, when you need it.  We recommend signing up for the patient portal called "MyChart".  Sign up information is  provided on this After Visit Summary.  MyChart is used to connect with patients for Virtual Visits (Telemedicine).  Patients are able to view lab/test results, encounter notes, upcoming appointments, etc.  Non-urgent messages can be sent to your provider as well.   To learn more about what you can do with MyChart, go to [email or web address].    Your next appointment:   1 year(s)  The format for your next appointment:   In Person  Provider:   Buford Dresser, MD         [facility],acting as a scribe for Buford Dresser, MD.,have documented all relevant documentation on the behalf of Buford Dresser, MD,as directed by  Buford Dresser, MD while in the presence of Buford Dresser, MD.   I, Buford Dresser, MD, have reviewed all documentation for this visit. The documentation on 12/09/21 for the exam, diagnosis, procedures, and orders are all accurate and complete.    Signed, Buford Dresser, MD PhD 11/28/2021  [place]

## 2021-12-05 NOTE — Progress Notes (Signed)
66 y.o. [id number] Married White or Caucasian Not Hispanic or Latino female here for annual exam.  H/O TVH/RSO. No vaginal bleeding. No dyspareunia.   Occasional urge incontinence, small amounts, stable. No GSI.   H/o cystocele, it doesn't really bother her. She is aware of it on occasion.   Normal bowel movements.     Patient's last menstrual period was 07/06/1997.          Sexually active: Yes.    The current method of family planning is status post hysterectomy.    Exercising: Yes.    Exercise is limited by knee injury She normally does cardio on the bike . Smoker:  no  Health Maintenance: Pap:  8/11 neg History of abnormal Pap:  no MMG:  09/16/21 density C Bi-rads 1 Neg  BMD:   2017 osteoporosis, with her primary. She isn't sure if she has had one since then.  Colonoscopy: 2014 f/u 56yr, she thinks she is UTD TDaP:  03/06/12 Gardasil: n/a   reports that she has never smoked. She has never used smokeless tobacco. She reports current alcohol use. She reports that she does not use drugs. Just occasional ETOH. She is retired. Has 7 children including 3 step children. Married x 41 years. Has 14 grandchildren and 3 great grandchildren.   Past Medical History:  Diagnosis Date   Arthritis    Bell's palsy 07/2016   Bladder stone    History of syncope    S/P CARDIAC ABLATION OF ARRYTHMIA   Hypothyroidism    Osteoporosis    Personal history of colonic adenomas 04/11/2013   S/P ablation operation for arrhythmia    1998 BY DR KCaryl Comes-  NO ISSUES SINCE    Past Surgical History:  Procedure Laterality Date   CARDIAC CATHETERIZATION  2002   with ablation   CARDIAC ELECTROPHYSIOLOGY STUDY AND ABLATION  1998 (APPROX)  DR KCaryl Comes  ARRHYTHMIA (PT UNSURE TYPE BUT HAD SYNCOPAL EPISODES)  PER PT NO S &S SINCE   COLONOSCOPY W/ POLYPECTOMY  04/07/2013   CYSTOSCOPY WITH BIOPSY N/A 02/21/2013   Procedure: CYSTOSCOPY WITH BIOPSY  Transurethral laser of bladder stone and foreign body;  Surgeon: SReece Packer MD;  Location: [facility];  Service: Urology;  Laterality: N/A;  60 mins req for this case    HOLMIUM LASER APPLICATION N/A 81/19/4174  Procedure: HOLMIUM LASER APPLICATION;  Surgeon: SReece Packer MD;  Location: [facility];  Service: Urology;  Laterality: N/A;   LAPAROSCOPIC ASSISTED VAGINAL HYSTERECTOMY  1999   W/ RIGHT SALPINGOOPHORECTOMY AND BLADDER TACK SUSPENSION   LEFT HEART CATH AND CORONARY ANGIOGRAPHY N/A 02/20/2020   Procedure: LEFT HEART CATH AND CORONARY ANGIOGRAPHY;  Surgeon: JMartinique Peter M, MD;  [facility];  Service: Cardiovascular;  Laterality: N/A;    Current Outpatient Medications  Medication Sig Dispense Refill   acetaminophen (TYLENOL) 650 MG CR tablet Take 1,300 mg by mouth at bedtime.     Calcium Citrate 200 MG TABS Take 400 mg by mouth daily.     Cholecalciferol (VITAMIN D-3) 125 MCG (5000 UT) TABS Take 15,000 Units by mouth daily.     diphenhydrAMINE (BENADRYL) 25 mg capsule Take 25 mg by mouth at bedtime as needed for allergies.     docusate sodium (COLACE) 100 MG capsule Take 200 mg by mouth at bedtime.     levothyroxine (SYNTHROID, LEVOTHROID) 175 MCG tablet Take 175 mcg by mouth daily before breakfast.      loratadine (CLARITIN) 10 MG  tablet Take 10 mg by mouth daily.     losartan (COZAAR) 50 MG tablet Take 50 mg by mouth every evening.  12   meloxicam (MOBIC) 15 MG tablet Take 1 tablet (15 mg total) by mouth daily. 30 tablet 1   metroNIDAZOLE (METROCREAM) 0.75 % cream Apply 1 application topically daily. For rosacea      Multiple Vitamin (MULTI-VITAMIN DAILY PO) Take 1 tablet by mouth daily. Vita Pack     omeprazole (PRILOSEC) 20 MG capsule Take 20 mg by mouth daily.     OVER THE COUNTER MEDICATION Take 1 capsule by mouth daily. "Instaflex Advance" joint support supplement     OVER THE COUNTER MEDICATION Take 50 mg by mouth at bedtime.     Psyllium (METAMUCIL PO) Take 5 mLs by mouth daily as needed (constipation).      RESVERATROL PO  Take 1,450 mg by mouth daily. 725 mg per tablet     rosuvastatin (CRESTOR) 5 MG tablet Take 1 tablet by mouth daily.     tretinoin (RETIN-A) 0.1 % cream Apply 1 application topically every evening.      Triamcinolone Acetonide (NASACORT ALLERGY 24HR NA) Place 2 sprays into the nose daily.     zinc gluconate 50 MG tablet Take 50 mg by mouth daily. 1 Daily     zolpidem (AMBIEN) 10 MG tablet zolpidem 10 mg tablet  TAKE 1 TABLET BY MOUTH EVERY NIGHT AS NEEDED FOR SLEEP     Bempedoic Acid (NEXLETOL) 180 MG TABS Take 180 mg by mouth daily. (Patient not taking: Reported on 12/08/2021) 30 tablet 11   No current facility-administered medications for this visit.    Family History  Problem Relation Age of Onset   Other Mother        caricosities   Hypertension Father    Prostate cancer Father    Other Sister        caricosities   Colon cancer Neg Hx    Diabetes Neg Hx    Heart disease Neg Hx    Kidney disease Neg Hx    Liver disease Neg Hx    Breast cancer Neg Hx     Review of Systems  All other systems reviewed and are negative.  Exam:   BP 110/72   Pulse 67   Ht 5' 7.5" (1.715 m)   Wt 168 lb (76.2 kg)   LMP 07/06/1997   SpO2 99%   BMI 25.92 kg/m   Weight change: '@WEIGHTCHANGE'$ @ Height:   Height: 5' 7.5" (171.5 cm)  Ht Readings from Last 3 Encounters:  12/08/21 5' 7.5" (1.715 m)  11/28/21 '5\' 8"'$  (1.727 m)  03/26/20 '5\' 8"'$  (1.727 m)    General appearance: alert, cooperative and appears stated age Head: Normocephalic, without obvious abnormality, atraumatic Neck: no adenopathy, supple, symmetrical, trachea midline and thyroid normal to inspection and palpation Breasts: normal appearance, no masses or tenderness Abdomen: soft, non-tender; non distended,  no masses,  no organomegaly Extremities: extremities normal, atraumatic, no cyanosis or edema Skin: Skin color, texture, turgor normal. No rashes or lesions Lymph nodes: Cervical, supraclavicular, and axillary nodes normal. No  abnormal inguinal nodes palpated Neurologic: Grossly normal   Pelvic: External genitalia:  no lesions              Urethra:  normal appearing urethra with no masses, tenderness or lesions              Bartholins and Skenes: normal  Vagina: atrophic appearing vagina with normal color and discharge, no lesions. Moderate grade 2 cystocele, grade 1 vault prolapse.               Cervix: absent               Bimanual Exam:  Uterus:  uterus absent              Adnexa: no mass, fullness, tenderness               Rectovaginal: Confirms               Anus:  normal sphincter tone, no lesions   1. Encounter for breast and pelvic examination Discussed breast self exam Discussed calcium and vit D intake Mammogram UTD She will check with her primary about her DEXA and colonoscopy. Her last DEXA in Epic was in 2017 and it reported osteoporosis  Labs with primary  2. Cystocele, midline Not bothersome. She feels she empties her bladder okay, sometimes leans forward  3. Vaginal vault prolapse Not bothersome  4. Urge incontinence Discussed Kegel exercises Information on incontinence was given Call if worsening

## 2021-12-08 ENCOUNTER — Ambulatory Visit (INDEPENDENT_AMBULATORY_CARE_PROVIDER_SITE_OTHER): Payer: Medicare Other | Admitting: Obstetrics and Gynecology

## 2021-12-08 ENCOUNTER — Encounter: Payer: Self-pay | Admitting: Obstetrics and Gynecology

## 2021-12-08 VITALS — BP 110/72 | HR 67 | Ht 67.5 in | Wt 168.0 lb

## 2021-12-08 DIAGNOSIS — Z01419 Encounter for gynecological examination (general) (routine) without abnormal findings: Secondary | ICD-10-CM

## 2021-12-08 DIAGNOSIS — N3941 Urge incontinence: Secondary | ICD-10-CM | POA: Diagnosis not present

## 2021-12-08 DIAGNOSIS — N8111 Cystocele, midline: Secondary | ICD-10-CM | POA: Diagnosis not present

## 2021-12-08 DIAGNOSIS — N819 Female genital prolapse, unspecified: Secondary | ICD-10-CM

## 2021-12-08 NOTE — Patient Instructions (Addendum)
About Cystocele  Overview  The pelvic organs, including the bladder, are normally supported by pelvic floor muscles and ligaments.  When these muscles and ligaments are stretched, weakened or torn, the wall between the bladder and the vagina sags or herniates causing a prolapse, sometimes called a cystocele.  This condition may cause discomfort and problems with emptying the bladder.  It can be present in various stages.  Some people are not aware of the changes.  Others may notice changes at the vaginal opening or a feeling of the bladder dropping outside the body.  Causes of a Cystocele  A cystocele is usually caused by muscle straining or stretching during childbirth.  In addition, cystocele is more common after menopause, because the hormone estrogen helps keep the elastic tissues around the pelvic organs strong.  A cystocele is more likely to occur when levels of estrogen decrease.  Other causes include: heavy lifting, chronic coughing, previous pelvic surgery and obesity.  Symptoms  A bladder that has dropped from its normal position may cause: unwanted urine leakage (stress incontinence), frequent urination or urge to urinate, incomplete emptying of the bladder (not feeling bladder relief after emptying), pain or discomfort in the vagina, pelvis, groin, lower back or lower abdomen and frequent urinary tract infections.  Mild cases may not cause any symptoms.  Treatment Options Pelvic floor (Kegel) exercises:  Strength training the muscles in your genital area Behavioral changes: Treating and preventing constipation, taking time to empty your bladder properly, learning to lift properly and/or avoid heavy lifting when possible, stopping smoking, avoiding weight gain and treating a chronic cough or bronchitis. A pessary: A vaginal support device is sometimes used to help pelvic support caused by muscle and ligament changes. Surgery: Surgical repair may be necessary if symptoms cannot be  managed with exercise, behavioral changes and a pessary.  Surgery is usually considered for severe cases.   2007, [employer] TherapeuticsUrinary Incontinence Urinary incontinence refers to a condition in which a person is unable to control where and when to pass urine. A person with this condition will urinate involuntarily. This means that the person urinates when he or she does not mean to. What are the causes? This condition may be caused by: Medicines. Infections. Constipation. Overactive bladder muscles. Weak bladder muscles. Weak pelvic floor muscles. These muscles provide support for the bladder, intestine, and, in women, the uterus. Enlarged prostate in men. The prostate is a gland near the bladder. When it gets too big, it can pinch the urethra. With the urethra blocked, the bladder can weaken and lose the ability to empty properly. Surgery. Emotional factors, such as anxiety, stress, or post-traumatic stress disorder (PTSD). Spinal cord injury, nerve injury, or other neurological conditions. Pelvic organ prolapse. This happens in women when organs move out of place and into the vagina. This movement can prevent the bladder and urethra from working properly. What increases the risk? The following factors may make you more likely to develop this condition: Age. The older you are, the higher the risk. Obesity. Being physically inactive. Pregnancy and childbirth. Menopause. Diseases that affect the nerves or spinal cord. Long-term, or chronic, coughing. This can increase pressure on the bladder and pelvic floor muscles. What are the signs or symptoms? Symptoms may vary depending on the type of urinary incontinence you have. They include: A sudden urge to urinate, and passing urine involuntarily before you can get to a bathroom (urge incontinence). Suddenly passing urine when doing activities that force urine to pass, such as  coughing, laughing, exercising, or sneezing (stress  incontinence). Needing to urinate often but urinating only a small amount, or constantly dribbling urine (overflow incontinence). Urinating because you cannot get to the bathroom in time due to a physical disability, such as arthritis or injury, or due to a communication or thinking problem, such as Alzheimer's disease (functional incontinence). How is this diagnosed? This condition may be diagnosed based on: Your medical history. A physical exam. Tests, such as: Urine tests. X-rays of your kidney and bladder. Ultrasound. CT scan. Cystoscopy. In this procedure, a health care provider inserts a tube with a light and camera (cystoscope) through the urethra and into the bladder to check for problems. Urodynamic testing. These tests assess how well the bladder, urethra, and sphincter can store and release urine. There are different types of urodynamic tests, and they vary depending on what the test is measuring. To help diagnose your condition, your health care provider may recommend that you keep a log of when you urinate and how much you urinate. How is this treated? Treatment for this condition depends on the type of incontinence that you have and its cause. Treatment may include: Lifestyle changes, such as: Quitting smoking. Maintaining a healthy weight. Staying active. Try to get 150 minutes of moderate-intensity exercise every week. Ask your health care provider which activities are safe for you. Eating a healthy diet. Avoid high-fat foods, like fried foods. Avoid refined carbohydrates like white bread and white rice. Limit how much alcohol and caffeine you drink. Increase your fiber intake. Healthy sources of fiber include beans, whole grains, and fresh fruits and vegetables. Behavioral changes, such as: Pelvic floor muscle exercises. Bladder training, such as lengthening the amount of time between bathroom breaks, or using the bathroom at regular intervals. Using techniques to  suppress bladder urges. This can include distraction techniques or controlled breathing exercises. Medicines, such as: Medicines to relax the bladder muscles and prevent bladder spasms. Medicines to help slow or prevent the growth of a man's prostate. Botox injections. These can help relax the bladder muscles. Treatments, such as: Using pulses of electricity to help change bladder reflexes (electrical nerve stimulation). For women, using a medical device to prevent urine leaks. This is a small, tampon-like, disposable device that is inserted into the urethra. Injecting collagen or carbon beads (bulking agents) into the urinary sphincter. These can help thicken tissue and close the bladder opening. Surgery. Follow these instructions at home: Lifestyle Limit alcohol and caffeine. These can fill your bladder quickly and irritate it. Keep yourself clean to help prevent odors and skin damage. Ask your health care provider about special skin creams and cleansers that can protect the skin from urine. Consider wearing pads or adult diapers. Make sure to change them regularly, and always change them right after experiencing incontinence. General instructions Take over-the-counter and prescription medicines only as told by your health care provider. Use the bathroom about every 3-4 hours, even if you do not feel the need to urinate. Try to empty your bladder completely every time. After urinating, wait a minute. Then try to urinate again. Make sure you are in a relaxed position while urinating. If your incontinence is caused by nerve problems, keep a log of the medicines you take and the times you go to the bathroom. Keep all follow-up visits. This is important. Where to find more information [employer] of Diabetes and Digestive and Kidney Diseases: [employer] American Urology Association: www.urologyhealth.org Contact a health care provider if: You have pain that  gets worse. Your  incontinence gets worse. Get help right away if: You have a fever or chills. You are unable to urinate. You have redness in your groin area or down your legs. Summary Urinary incontinence refers to a condition in which a person is unable to control where and when to pass urine. This condition may be caused by medicines, infection, weak bladder muscles, weak pelvic floor muscles, enlargement of the prostate (in men), or surgery. Factors such as older age, obesity, pregnancy and childbirth, menopause, neurological diseases, and chronic coughing may increase your risk for developing this condition. Types of urinary incontinence include urge incontinence, stress incontinence, overflow incontinence, and functional incontinence. This condition is usually treated first with lifestyle and behavioral changes, such as quitting smoking, eating a healthier diet, and doing regular pelvic floor exercises. Other treatment options include medicines, bulking agents, medical devices, electrical nerve stimulation, or surgery. This information is not intended to replace advice given to you by your health care provider. Make sure you discuss any questions you have with your health care provider. Document Revised: 01/26/2020 Document Reviewed: 01/26/2020 Elsevier Patient Education  [place]. Kegel Exercises  Kegel exercises can help strengthen your pelvic floor muscles. The pelvic floor is a group of muscles that support your rectum, small intestine, and bladder. In females, pelvic floor muscles also help support the uterus. These muscles help you control the flow of urine and stool (feces). Kegel exercises are painless and simple. They do not require any equipment. Your provider may suggest Kegel exercises to: Improve bladder and bowel control. Improve sexual response. Improve weak pelvic floor muscles after surgery to remove the uterus (hysterectomy) or after pregnancy, in females. Improve weak pelvic  floor muscles after prostate gland removal or surgery, in males. Kegel exercises involve squeezing your pelvic floor muscles. These are the same muscles you squeeze when you try to stop the flow of urine or keep from passing gas. The exercises can be done while sitting, standing, or lying down, but it is best to vary your position. Ask your health care provider which exercises are safe for you. Do exercises exactly as told by your health care provider and adjust them as directed. Do not begin these exercises until told by your health care provider. Exercises How to do Kegel exercises: Squeeze your pelvic floor muscles tight. You should feel a tight lift in your rectal area. If you are a female, you should also feel a tightness in your vaginal area. Keep your stomach, buttocks, and legs relaxed. Hold the muscles tight for up to 10 seconds. Breathe normally. Relax your muscles for up to 10 seconds. Repeat as told by your health care provider. Repeat this exercise daily as told by your health care provider. Continue to do this exercise for at least 4-6 weeks, or for as long as told by your health care provider. You may be referred to a physical therapist who can help you learn more about how to do Kegel exercises. Depending on your condition, your health care provider may recommend: Varying how long you squeeze your muscles. Doing several sets of exercises every day. Doing exercises for several weeks. Making Kegel exercises a part of your regular exercise routine. This information is not intended to replace advice given to you by your health care provider. Make sure you discuss any questions you have with your health care provider. Document Revised: 10/31/2020 Document Reviewed: 10/31/2020 Elsevier Patient Education  [place].

## 2021-12-09 ENCOUNTER — Encounter (HOSPITAL_BASED_OUTPATIENT_CLINIC_OR_DEPARTMENT_OTHER): Payer: Self-pay | Admitting: Cardiology

## 2022-03-23 ENCOUNTER — Other Ambulatory Visit (HOSPITAL_COMMUNITY): Payer: Self-pay | Admitting: *Deleted

## 2022-03-23 DIAGNOSIS — M810 Age-related osteoporosis without current pathological fracture: Secondary | ICD-10-CM

## 2022-03-24 ENCOUNTER — Ambulatory Visit (HOSPITAL_COMMUNITY)
Admission: RE | Admit: 2022-03-24 | Discharge: 2022-03-24 | Disposition: A | Discharge status: Home / Self Care | Payer: Medicare Other | Source: Ambulatory Visit | Attending: Internal Medicine | Admitting: Internal Medicine

## 2022-03-24 DIAGNOSIS — M810 Age-related osteoporosis without current pathological fracture: Secondary | ICD-10-CM | POA: Diagnosis not present

## 2022-03-24 LAB — CREATININE, SERUM
Creatinine, Ser: 0.67 mg/dL (ref 0.44–1.00)
GFR, Estimated: >60 mL/min (ref >60)

## 2022-03-24 LAB — CALCIUM: Calcium: 9.7 mg/dL (ref 8.9–10.3)

## 2022-03-24 MED ORDER — ZOLEDRONIC ACID 5 MG/100ML IV SOLN: 5.0000 mg | Freq: Once | INTRAVENOUS | Status: AC

## 2022-03-24 MED ORDER — ZOLEDRONIC ACID 5 MG/100ML IV SOLN
INTRAVENOUS | Status: AC
  Administered 2022-03-24: 5 mg via INTRAVENOUS
  Filled 2022-03-24: qty 100

## 2023-06-07 IMAGING — MG MM DIGITAL SCREENING BILAT W/ TOMO AND CAD
8 series · 8 of 24 positions shown · non-contrast
Comparison: Previous exam(s).

CLINICAL DATA: Screening.

EXAM:
DIGITAL SCREENING BILATERAL MAMMOGRAM WITH TOMOSYNTHESIS AND CAD
TECHNIQUE: Bilateral screening digital craniocaudal and mediolateral oblique
mammograms were obtained. Bilateral screening digital breast
tomosynthesis was performed. The images were evaluated with
computer-aided detection.

[R CC synth-2D]
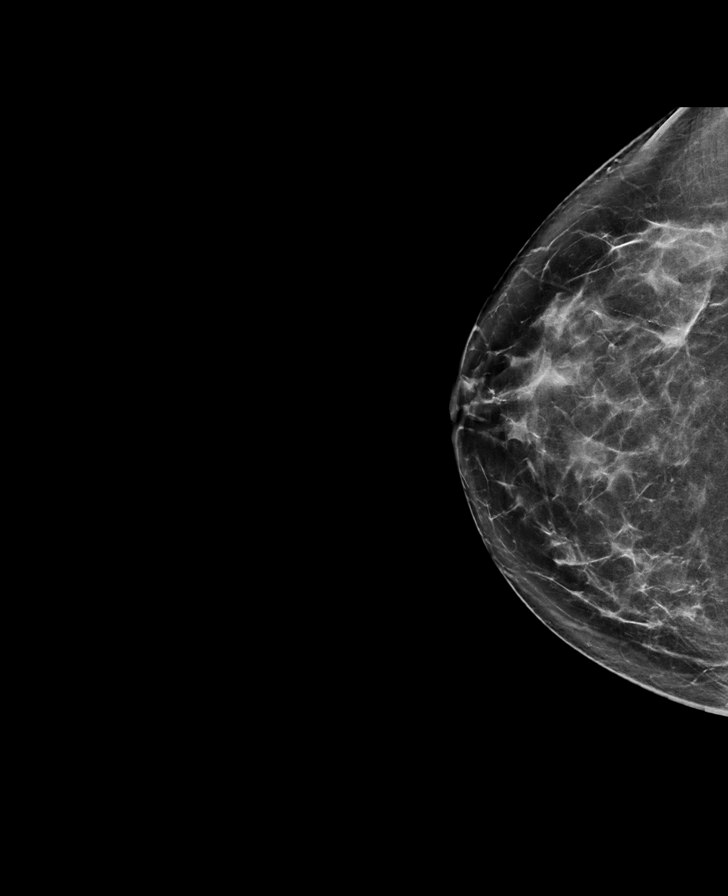

[L MLO synth-2D]
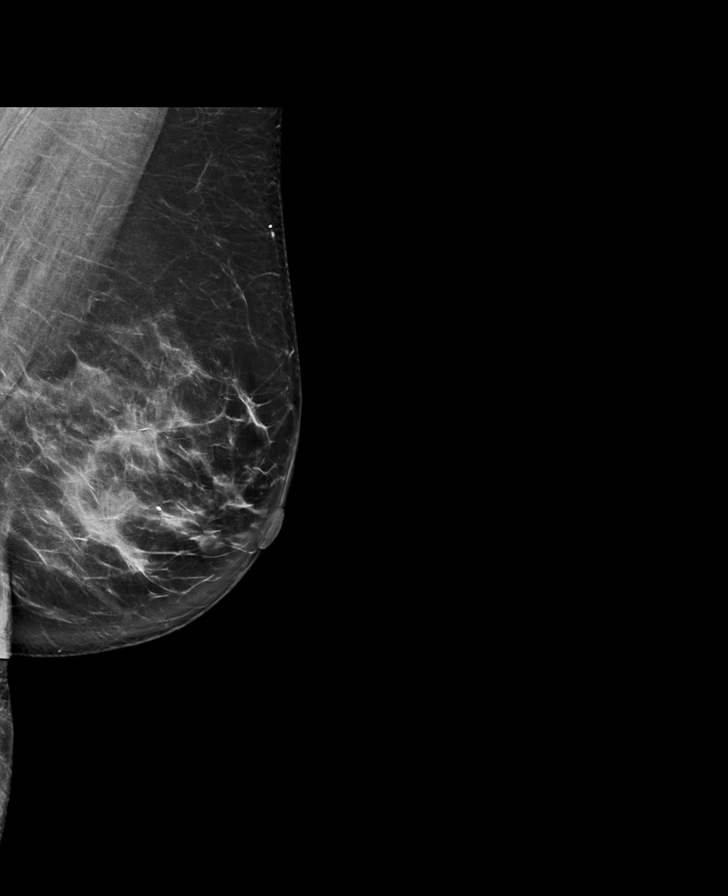

[R MLO synth-2D]
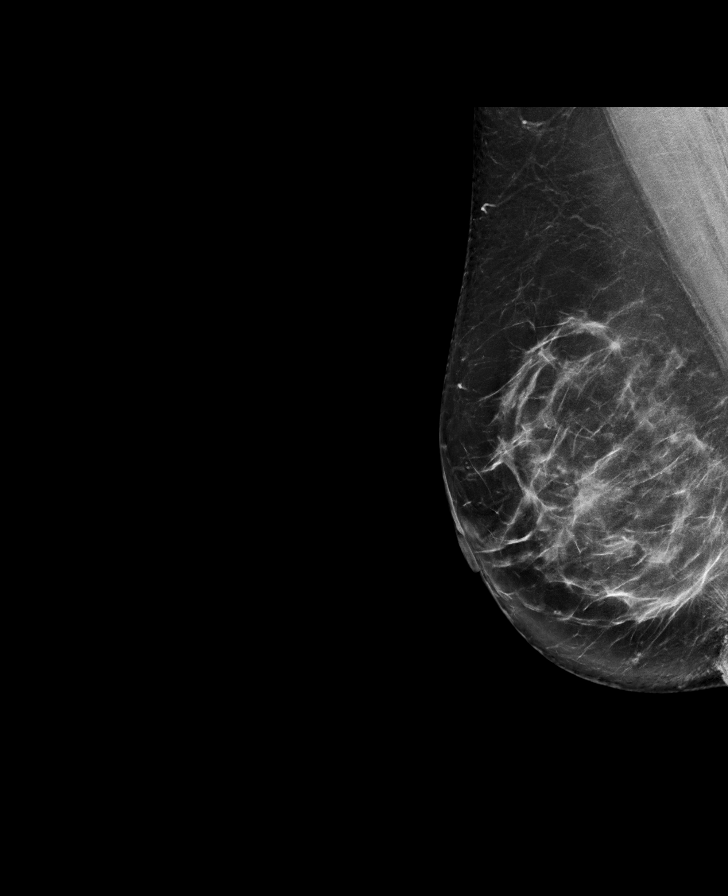

[L CC synth-2D]
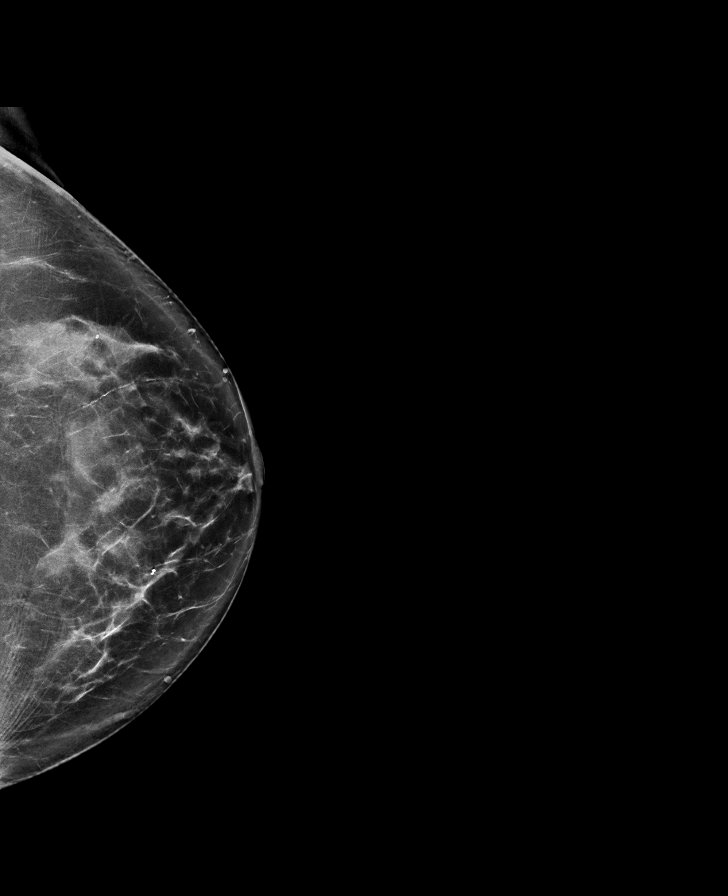

[R CC tomo · tomo slice 42/83.0]
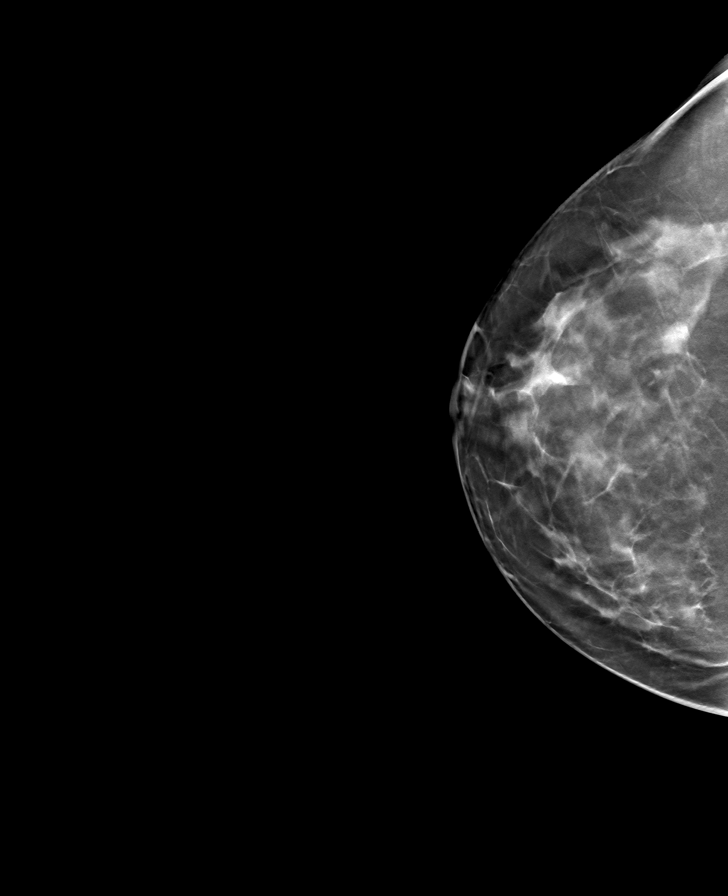

[R MLO tomo · tomo slice 46/91.0]
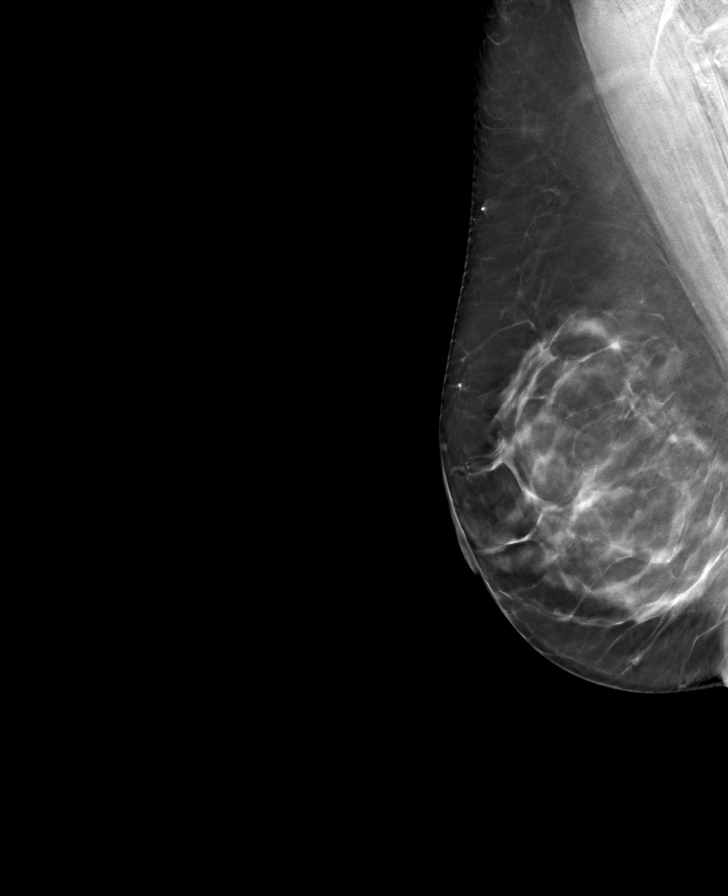

[L MLO tomo · tomo slice 43/85.0]
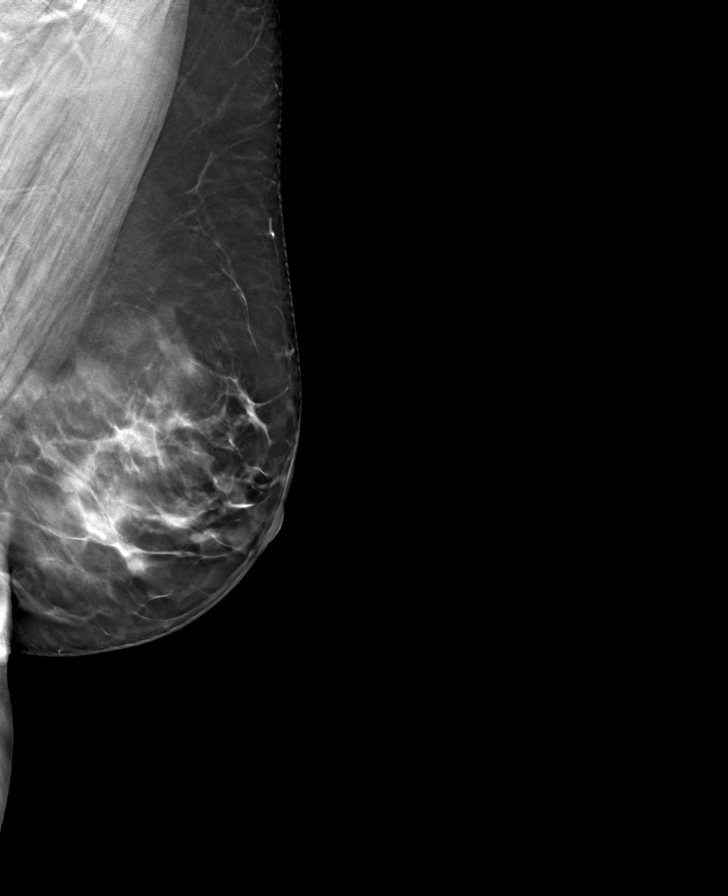

[L CC tomo · tomo slice 44/87.0]
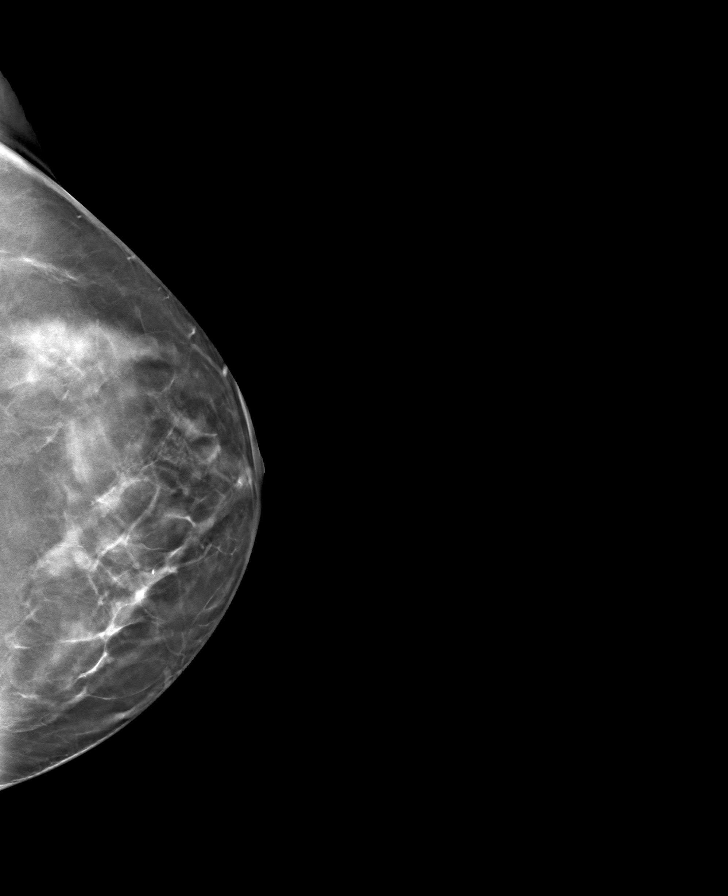

[8 of 24 positions shown; findings below may reference images not displayed]

ACR Breast Density Category c: The breast tissue is heterogeneously
dense, which may obscure small masses.
FINDINGS: There are no findings suspicious for malignancy.
IMPRESSION: No mammographic evidence of malignancy. A result letter of this
screening mammogram will be mailed directly to the patient.

RECOMMENDATION:
Screening mammogram in one year. (Code:[id number]-W-BC3)

BI-RADS CATEGORY  1: Negative.

## 2023-08-30 ENCOUNTER — Other Ambulatory Visit: Payer: Self-pay | Admitting: Internal Medicine

## 2023-08-30 DIAGNOSIS — Z1231 Encounter for screening mammogram for malignant neoplasm of breast: Secondary | ICD-10-CM

## 2023-09-09 ENCOUNTER — Ambulatory Visit: Payer: Medicare Other

## 2023-09-21 ENCOUNTER — Ambulatory Visit
Admission: RE | Admit: 2023-09-21 | Discharge: 2023-09-21 | Disposition: A | Discharge status: Home / Self Care | Source: Ambulatory Visit | Attending: Internal Medicine | Admitting: Internal Medicine

## 2023-09-21 DIAGNOSIS — Z1231 Encounter for screening mammogram for malignant neoplasm of breast: Secondary | ICD-10-CM

## 2023-09-23 ENCOUNTER — Other Ambulatory Visit (HOSPITAL_COMMUNITY): Payer: Self-pay | Admitting: Family Medicine

## 2023-09-23 ENCOUNTER — Ambulatory Visit (HOSPITAL_COMMUNITY)
Admission: RE | Admit: 2023-09-23 | Discharge: 2023-09-23 | Disposition: A | Discharge status: Home / Self Care | Source: Ambulatory Visit | Attending: Family Medicine | Admitting: Family Medicine

## 2023-09-23 DIAGNOSIS — R1011 Right upper quadrant pain: Secondary | ICD-10-CM

## 2023-09-24 ENCOUNTER — Ambulatory Visit (HOSPITAL_COMMUNITY)

## 2023-11-12 ENCOUNTER — Ambulatory Visit: Payer: Self-pay | Admitting: General Surgery

## 2023-11-12 ENCOUNTER — Telehealth (HOSPITAL_BASED_OUTPATIENT_CLINIC_OR_DEPARTMENT_OTHER): Payer: Self-pay | Admitting: *Deleted

## 2023-11-12 NOTE — Telephone Encounter (Signed)
   Pre-operative Risk Assessment    Patient Name: Natalie Schultz  DOB: 06/26/1956 MRN: [id number]   Date of last office visit: 11/28/2021 Date of next office visit: None  Request for Surgical Clearance   Procedure:   Gallbaldder surgery Date of Surgery:  Clearance TBD                               Surgeon:  Dr. Armond Bertin Surgeon's Group or Practice Name:  [facility] Phone number:  [phone] Fax number:  [phone]   Type of Clearance Requested:   - Medical    Type of Anesthesia:  General    Additional requests/questions:    Signed, Lauris Port   11/12/2023, 1:09 PM

## 2023-11-15 NOTE — Telephone Encounter (Signed)
   Name: Chadwick Yuen  DOB: Mar 23, 1956  MRN: [id number]  Primary Cardiologist: Sheryle Donning, MD  Chart reviewed as part of pre-operative protocol coverage. Because of Aailyah Roeber Moorman's past medical history and time since last visit, she will require a follow-up in-office visit in order to better assess preoperative cardiovascular risk.  Pre-op covering staff: - Please schedule appointment and call patient to inform them. If patient already had an upcoming appointment within acceptable timeframe, please add "pre-op clearance" to the appointment notes so provider is aware. - Please contact requesting surgeon's office via preferred method (i.e, phone, fax) to inform them of need for appointment prior to surgery.   Morey Ar, NP  11/15/2023, 12:29 PM

## 2023-11-15 NOTE — Telephone Encounter (Signed)
 S/W pt and scheduled IN OFFICE Preop appt 12/01/23 with Palmer Bobo, NP for Preop Clearance

## 2023-12-01 ENCOUNTER — Encounter: Payer: Self-pay | Admitting: Emergency Medicine

## 2023-12-01 ENCOUNTER — Ambulatory Visit: Attending: Emergency Medicine | Admitting: Emergency Medicine

## 2023-12-01 VITALS — BP 122/70 | HR 53 | Ht 68.0 in | Wt 165.0 lb

## 2023-12-01 DIAGNOSIS — I2510 Atherosclerotic heart disease of native coronary artery without angina pectoris: Secondary | ICD-10-CM

## 2023-12-01 DIAGNOSIS — Z01810 Encounter for preprocedural cardiovascular examination: Secondary | ICD-10-CM

## 2023-12-01 DIAGNOSIS — I471 Supraventricular tachycardia, unspecified: Secondary | ICD-10-CM

## 2023-12-01 DIAGNOSIS — E782 Mixed hyperlipidemia: Secondary | ICD-10-CM | POA: Diagnosis not present

## 2023-12-01 DIAGNOSIS — I10 Essential (primary) hypertension: Secondary | ICD-10-CM | POA: Diagnosis not present

## 2023-12-01 NOTE — Progress Notes (Signed)
 Cardiology Office Note:    Date:  12/01/2023  ID:  Natalee, Tomkiewicz 1955/08/18, MRN [id number] PCP: Bertha Broad, MD  [facility] Providers Cardiologist:  Sheryle Donning, MD       Patient Profile:       Chief Complaint: Preoperative cardiovascular exam History of Present Illness:  Cahterine Heinzel is a 68 y.o. female with visit-pertinent history of palpitations, hypothyroidism, hypertension, hyperlipidemia, coronary artery disease  She was initially seen on 02/16/2020 for evaluation and management of chest pain.  She underwent cardiac catheterization on 02/20/2020 showing nonobstructive CAD with proximal LAD to mid LAD lesion 25% stenosis, mid LAD lesion is 30% stenosed, proximal RCA lesion is 50% stenosis, LV systolic function normal at 55 to 65%.  Medical management was recommended.  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  Zio patch 02/28/2020 showed minimum heart rate 49 bpm, max heart rate of 174 bpm and average heart rate of 74 bpm.  Predominant underlying rhythm was sinus rhythm.  There were 26 episodes of brief SVT, the run with the fastest interval lasting 6 beats with a max rate of 174 bpm, the longest lasting 20 beats with an average rate of 139 bpm.  Isolated atrial ectopy was occasional and isolated ventricular ectopy was rare.  She was last seen in clinic on 11/28/2021.  She was doing well at the time.  Options for management of brief/proximal SVT were discussed.  Patient would like to avoid medications.  Vagal maneuvers were discussed.  She was started on bempedoic acid .  She was to follow-up in 1 year.  She is now pending gallbladder surgery with [facility] [place] surgery on date TBD.   Discussed the use of AI scribe software for clinical note transcription with the patient, who gave  verbal consent to proceed.  History of Present Illness Dezi Brauner is a 68 year old female with coronary artery disease who presents for cardiac clearance prior to gallbladder surgery.  Today patient is doing well overall.  She denies any acute cardiovascular concerns or complaints.  She remains very active.  She rides her indoor bike at least 30 to 45 minutes daily.  She denies any exertional symptoms.  She experiences no chest pain, palpitations, dyspnea, orthopnea, PND, syncope, or lightheadedness. Her heart rate is typically in the 50s to 60s.  She monitors her heart rate on her Apple Watch.  She underwent catheter ablation many years ago for syncope related to supraventricular tachycardia.  She is experiencing nausea and following a bland diet, resulting in a 7-pound weight loss. She attributes abdominal pain and nausea to her gallbladder issues.  Her blood pressure has improved over the last year, and her cholesterol levels are excellent on her current regimen.  Her family history includes her father having congestive heart failure and atrial fibrillation. She is the primary caregiver for her father, who is 63 years old.  Review of systems:  Please see the history of present illness. All other systems are reviewed and otherwise negative.      Studies Reviewed:        Zio patch 02/28/2020 13 days of data recorded on Zio monitor. Patient had a min HR of 49 bpm, max HR of 174 bpm, and avg HR of 74 bpm. Predominant underlying rhythm was Sinus Rhythm. No VT, atrial fibrillation, high degree block,  or pauses noted. There were 26 episodes of brief SVT, the run with the fastest interval lasting 6 beats with a max rate of 174 bpm, the longest lasting 20 beats with an avg rate of 139 bpm. Isolated atrial ectopy was occasional, and isolated ventricular ectopy was rare. There were 94 triggered events.   Echocardiogram 02/27/2020 1. Left ventricular ejection fraction, by estimation, is  55 to 60%. The  left ventricle has normal function. The left ventricle has no regional  wall motion abnormalities. There is mild left ventricular hypertrophy.  Left ventricular diastolic parameters  are consistent with Grade I diastolic dysfunction (impaired relaxation).   2. Right ventricular systolic function is normal. The right ventricular  size is normal. There is normal pulmonary artery systolic pressure. The  estimated right ventricular systolic pressure is 19.2 mmHg.   3. The mitral valve is normal in structure. No evidence of mitral valve  regurgitation. No evidence of mitral stenosis.   4. The aortic valve is tricuspid. Aortic valve regurgitation is not  visualized. No aortic stenosis is present.   5. Aortic dilatation noted. 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   6. The inferior vena cava is normal in size with greater than 50%  respiratory variability, suggesting right atrial pressure of 3 mmHg.   Cardiac catheterization 02/20/2020 Prox LAD to Mid LAD lesion is 25% stenosed. Mid LAD lesion is 30% stenosed. Prox RCA lesion is 15% stenosed. The left ventricular systolic function is normal. LV end diastolic pressure is normal. The left ventricular ejection fraction is 55-65% by visual estimate.   1. Nonobstructive CAD 2. Normal LV function 3. Normal LVEDP Diagnostic Dominance: Right   Risk Assessment/Calculations:              Physical Exam:   VS:  BP 122/70 (BP Location: Right Arm, Patient Position: Sitting, Cuff Size: Normal)   Pulse (!) 53   Ht 5\' 8"  (1.727 m)   Wt 165 lb (74.8 kg)   LMP 07/06/1997   BMI 25.09 kg/m    Wt Readings from Last 3 Encounters:  12/01/23 165 lb (74.8 kg)  12/08/21 168 lb (76.2 kg)  11/28/21 162 lb (73.5 kg)    GEN: Well nourished, well developed in no acute distress NECK: No JVD; No carotid bruits CARDIAC: RRR, no murmurs, rubs, gallops RESPIRATORY:  Clear to auscultation without rales, wheezing or  rhonchi  ABDOMEN: Soft, non-tender, non-distended EXTREMITIES:  No edema; No acute deformity      Assessment and Plan:  Preoperative cardiovascular exam According to the Revised Cardiac Risk Index (RCRI), her Perioperative Risk of Major Cardiac Event is (%): 0.4. Her Functional Capacity in METs is: 6.61 according to the Duke Activity Status Index (DASI). Therefore, based on ACC/AHA guidelines, patient would be at acceptable risk for the planned procedure without further cardiovascular testing. I will route this recommendation to the requesting party via Epic fax function.   Coronary artery disease LHC 02/2020 with nonobstructive CAD with proximal LAD to mid LAD 25% stenosis, mid LAD 30% stenosis, proximal RCA 15 % stenosis, normal LVEDP/LV function EKG today without acute ischemic changes - Today and since last office visit patient has been without any anginal symptoms.  She remains very active and denies any exertional angina.  No indication for further ischemic evaluation at this time - Continue rosuvastatin 5 mg daily  Hyperlipidemia LDL 76, HDL 44, TG 133 on 47/8295 LDL slightly above goal of less than 70 - She  will continue to work on heart healthy dieting and physical exercise to reach goal.  She has recently had 7 pound weight loss with her new diet - Managed by her PCP - Continue rosuvastatin 5 mg daily  SVT ZIO 02/2020 showed 26 episodes of SVT - Today patient denies any symptoms concerning for recurrent SVT - Remains stable without beta-blocking therapy - Will continue to clinically monitor  Hypertension Blood pressure today is 122/70 under excellent control - Maintain home BP monitoring along - Continue losartan 50 mg daily     Dispo:  Return in about 1 year (around 11/30/2024).  Signed, Ava Boatman, NP

## 2023-12-01 NOTE — Patient Instructions (Signed)
 Medication Instructions:  NO CHANGES   Lab Work: NONE   Testing/Procedures: NONE  Follow-Up: At [facility], you and your health needs are our priority.  As part of our continuing mission to provide you with exceptional heart care, our providers are all part of one team.  This team includes your primary Cardiologist (physician) and Advanced Practice Providers or APPs (Physician Assistants and Nurse Practitioners) who all work together to provide you with the care you need, when you need it.  Your next appointment:   1 year  Provider:   Sheryle Donning, MD OR Palmer Bobo, [place]

## 2024-01-06 NOTE — Pre-Procedure Instructions (Signed)
 Surgical Instructions   Your procedure is scheduled on Monday, January 17, 2024. Report to [facility] Main Entrance A at 5:30 A.M., then check in with the Admitting office. Any questions or running late day of surgery: call [phone]  Questions prior to your surgery date: call [phone], Monday-Friday, 8am-4pm. If you experience any cold or flu symptoms such as cough, fever, chills, shortness of breath, etc. between now and your scheduled surgery, please notify us  at the above number.     Remember:  Do not eat after midnight the night before your surgery   You may drink clear liquids until 4:30am the morning of your surgery.   Clear liquids allowed are: Water , Non-Citrus Juices (without pulp), Carbonated Beverages, Clear Tea (no milk, honey, etc.), Black Coffee Only (NO MILK, CREAM OR POWDERED CREAMER of any kind), and Gatorade.    Take these medicines the morning of surgery with A SIP OF WATER  : Bisoprolol (Zebeta) Levothyroxine (Synthroid) Loratadine (Claritin) Omeprazole  (Prilosec ) Rosuvastatin (Crestor) Triamcinolone Acetonide (Nasacort Allergy) nasal spray   One week prior to surgery, STOP taking any Aspirin  (unless otherwise instructed by your surgeon) Aleve, Naproxen, Ibuprofen, Motrin, Advil, Goody's, BC's, all herbal medications, fish oil, and non-prescription vitamins. This includes your Meloxicam  (MOBIC ).                     Do NOT Smoke (Tobacco/Vaping) for 24 hours prior to your procedure.  If you use a CPAP at night, you may bring your mask/headgear for your overnight stay.   You will be asked to remove any contacts, glasses, piercing's, hearing aid's, dentures/partials prior to surgery. Please bring cases for these items if needed.    Patients discharged the day of surgery will not be allowed to drive home, and someone needs to stay with them for 24 hours.  SURGICAL WAITING ROOM VISITATION Patients may have no more than 2 support people in the waiting area  - these visitors may rotate.   Pre-op nurse will coordinate an appropriate time for 1 ADULT support person, who may not rotate, to accompany patient in pre-op.  Children under the age of 73 must have an adult with them who is not the patient and must remain in the main waiting area with an adult.  If the patient needs to stay at the hospital during part of their recovery, the visitor guidelines for inpatient rooms apply.  Please refer to the [facility] website for the visitor guidelines for any additional information.   If you received a COVID test during your pre-op visit  it is requested that you wear a mask when out in public, stay away from anyone that may not be feeling well and notify your surgeon if you develop symptoms. If you have been in contact with anyone that has tested positive in the last 10 days please notify you surgeon.      Pre-operative CHG Bathing Instructions   You can play a key role in reducing the risk of infection after surgery. Your skin needs to be as free of germs as possible. You can reduce the number of germs on your skin by washing with CHG (chlorhexidine gluconate) soap before surgery. CHG is an antiseptic soap that kills germs and continues to kill germs even after washing.   DO NOT use if you have an allergy to chlorhexidine/CHG or antibacterial soaps. If your skin becomes reddened or irritated, stop using the CHG and notify one of our RNs at [phone].  TAKE A SHOWER THE NIGHT BEFORE SURGERY AND THE DAY OF SURGERY    Please keep in mind the following:  DO NOT shave, including legs and underarms, 48 hours prior to surgery.   You may shave your face before/day of surgery.  Place clean sheets on your bed the night before surgery Use a clean washcloth (not used since being washed) for each shower. DO NOT sleep with pet's night before surgery.  CHG Shower Instructions:  Wash your face and private area with normal soap. If you choose to wash  your hair, wash first with your normal shampoo.  After you use shampoo/soap, rinse your hair and body thoroughly to remove shampoo/soap residue.  Turn the water  OFF and apply half the bottle of CHG soap to a CLEAN washcloth.  Apply CHG soap ONLY FROM YOUR NECK DOWN TO YOUR TOES (washing for 3-5 minutes)  DO NOT use CHG soap on face, private areas, open wounds, or sores.  Pay special attention to the area where your surgery is being performed.  If you are having back surgery, having someone wash your back for you may be helpful. Wait 2 minutes after CHG soap is applied, then you may rinse off the CHG soap.  Pat dry with a clean towel  Put on clean pajamas    Additional instructions for the day of surgery: DO NOT APPLY any lotions, deodorants, cologne, or perfumes.   Do not wear jewelry or makeup Do not wear nail polish, gel polish, artificial nails, or any other type of covering on natural nails (fingers and toes) Do not bring valuables to the hospital. [facility] is not responsible for valuables/personal belongings. Put on clean/comfortable clothes.  Please brush your teeth.  Ask your nurse before applying any prescription medications to the skin.

## 2024-01-10 ENCOUNTER — Encounter (HOSPITAL_COMMUNITY): Payer: Self-pay

## 2024-01-10 ENCOUNTER — Other Ambulatory Visit: Payer: Self-pay

## 2024-01-10 ENCOUNTER — Encounter (HOSPITAL_COMMUNITY)
Admission: RE | Admit: 2024-01-10 | Discharge: 2024-01-10 | Disposition: A | Discharge status: Home / Self Care | Source: Ambulatory Visit | Attending: General Surgery | Admitting: General Surgery

## 2024-01-10 VITALS — BP 132/74 | HR 53 | Temp 98.2°F | Resp 18 | Ht 67.0 in | Wt 166.2 lb

## 2024-01-10 DIAGNOSIS — I2510 Atherosclerotic heart disease of native coronary artery without angina pectoris: Secondary | ICD-10-CM | POA: Insufficient documentation

## 2024-01-10 DIAGNOSIS — G510 Bell's palsy: Secondary | ICD-10-CM | POA: Insufficient documentation

## 2024-01-10 DIAGNOSIS — I471 Supraventricular tachycardia, unspecified: Secondary | ICD-10-CM | POA: Diagnosis not present

## 2024-01-10 DIAGNOSIS — E039 Hypothyroidism, unspecified: Secondary | ICD-10-CM | POA: Diagnosis not present

## 2024-01-10 DIAGNOSIS — Z01812 Encounter for preprocedural laboratory examination: Secondary | ICD-10-CM | POA: Insufficient documentation

## 2024-01-10 DIAGNOSIS — E785 Hyperlipidemia, unspecified: Secondary | ICD-10-CM | POA: Diagnosis not present

## 2024-01-10 DIAGNOSIS — Z01818 Encounter for other preprocedural examination: Secondary | ICD-10-CM

## 2024-01-10 DIAGNOSIS — I10 Essential (primary) hypertension: Secondary | ICD-10-CM | POA: Insufficient documentation

## 2024-01-10 LAB — CBC
HCT: 41.8 % (ref 36.0–46.0)
Hemoglobin: 13.7 g/dL (ref 12.0–15.0)
MCH: 30.5 pg (ref 26.0–34.0)
MCHC: 32.8 g/dL (ref 30.0–36.0)
MCV: 93.1 fL (ref 80.0–100.0)
Platelets: 167 K/uL (ref 150–400)
RBC: 4.49 MIL/uL (ref 3.87–5.11)
RDW: 12.4 % (ref 11.5–15.5)
WBC: 5.7 K/uL (ref 4.0–10.5)
nRBC: 0 % (ref 0.0–0.2)

## 2024-01-10 LAB — BASIC METABOLIC PANEL WITH GFR
Anion gap: 10 (ref 5–15)
BUN: 26 mg/dL — ABNORMAL HIGH (ref 8–23)
CO2: 27 mmol/L (ref 22–32)
Calcium: 9.6 mg/dL (ref 8.9–10.3)
Chloride: 101 mmol/L (ref 98–111)
Creatinine, Ser: 0.68 mg/dL (ref 0.44–1.00)
GFR, Estimated: >60 mL/min (ref >60)
Glucose, Bld: 83 mg/dL (ref 70–99)
Potassium: 3.8 mmol/L (ref 3.5–5.1)
Sodium: 138 mmol/L (ref 135–145)

## 2024-01-10 NOTE — Progress Notes (Signed)
 PCP - Dr. Jakie Cardiologist - Dr. Lonni  PPM/ICD - n/a Device Orders -  Rep Notified -   Chest x-ray - n/a EKG - 12/01/23 Stress Test - denies ECHO - 02/27/20 Cardiac Cath - 02/20/20  Sleep Study - denies CPAP -   Fasting Blood Sugar - n/a  Checks Blood Sugar _____ times a day  Last dose of GLP1 agonist-  n/a GLP1 instructions:   Blood Thinner Instructions: n/a Aspirin  Instructions:  ERAS Protcol - clears until 4:30 PRE-SURGERY Ensure or G2-   COVID TEST- no   Anesthesia review: Yes, heart history  Patient denies shortness of breath, fever, cough and chest pain at PAT appointment   All instructions explained to the patient, with a verbal understanding of the material. Patient agrees to go over the instructions while at home for a better understanding.

## 2024-01-11 NOTE — Progress Notes (Signed)
 Anesthesia Chart Review:  Follow-up 69 year old female follows with cardiology for history of nonobstructive CAD, HLD, HTN, SVT s/p remote ablation.  Cath 02/2020 with nonobstructive CAD.  Echo 02/2020 with EF 55 to 60%, grade 1 DD, normal RV, no significant valvular abnormalities.  Last seen by Lum Louis, NP on 12/01/2023 and upcoming surgery discussed.  Per note, According to the Revised Cardiac Risk Index (RCRI), her Perioperative Risk of Major Cardiac Event is (%): 0.4. Her Functional Capacity in METs is: 6.61 according to the Duke Activity Status Index (DASI). Therefore, based on ACC/AHA guidelines, patient would be at acceptable risk for the planned procedure without further cardiovascular testing. I will route this recommendation to the requesting party via Epic fax function.  Other pertinent history includes hypothyroidism, Bell's palsy (2018).  Preop labs reviewed, unremarkable.  EKG 12/01/2023: Sinus bradycardia.  Rate 53. Nonspecific T wave abnormality  Zio patch 02/28/2020 13 days of data recorded on Zio monitor. Patient had a min HR of 49 bpm, max HR of 174 bpm, and avg HR of 74 bpm. Predominant underlying rhythm was Sinus Rhythm. No VT, atrial fibrillation, high degree block, or pauses noted. There were 26 episodes of brief SVT, the run with the fastest interval lasting 6 beats with a max rate of 174 bpm, the longest lasting 20 beats with an avg rate of 139 bpm. Isolated atrial ectopy was occasional, and isolated ventricular ectopy was rare. There were 94 triggered events.    Echocardiogram 02/27/2020 1. Left ventricular ejection fraction, by estimation, is 55 to 60%. The  left ventricle has normal function. The left ventricle has no regional  wall motion abnormalities. There is mild left ventricular hypertrophy.  Left ventricular diastolic parameters  are consistent with Grade I diastolic dysfunction (impaired relaxation).   2. Right ventricular systolic function is normal. The  right ventricular  size is normal. There is normal pulmonary artery systolic pressure. The  estimated right ventricular systolic pressure is 19.2 mmHg.   3. The mitral valve is normal in structure. No evidence of mitral valve  regurgitation. No evidence of mitral stenosis.   4. The aortic valve is tricuspid. Aortic valve regurgitation is not  visualized. No aortic stenosis is present.   5. Aortic dilatation noted. 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   6. The inferior vena cava is normal in size with greater than 50%  respiratory variability, suggesting right atrial pressure of 3 mmHg.    Cardiac catheterization 02/20/2020 Prox LAD to Mid LAD lesion is 25% stenosed. Mid LAD lesion is 30% stenosed. Prox RCA lesion is 15% stenosed. The left ventricular systolic function is normal. LV end diastolic pressure is normal. The left ventricular ejection fraction is 55-65% by visual estimate.   1. Nonobstructive CAD 2. Normal LV function 3. Normal LVEDP    Lynwood Geofm RIGGERS [facility]/Anesthesiology Phone [phone] 01/11/2024 2:37 PM

## 2024-01-11 NOTE — Anesthesia Preprocedure Evaluation (Addendum)
 Anesthesia Evaluation  Patient identified by MRN, date of birth, ID band Patient awake    Reviewed: Allergy & Precautions, NPO status , Patient's Chart, lab work & pertinent test results  History of Anesthesia Complications Negative for: history of anesthetic complications  Airway Mallampati: III  TM Distance: >3 FB Neck ROM: Full    Dental no notable dental hx. (+) Teeth Intact   Pulmonary neg pulmonary ROS, neg sleep apnea, neg COPD, Patient abstained from smoking.Not current smoker   Pulmonary exam normal breath sounds clear to auscultation       Cardiovascular Exercise Tolerance: Good METShypertension, (-) CAD and (-) Past MI (-) dysrhythmias  Rhythm:Regular Rate:Normal - Systolic murmurs Follow-up 68 year old female follows with cardiology for history of nonobstructive CAD, HLD, HTN, SVT s/p remote ablation.  Cath 02/2020 with nonobstructive CAD.  Echo 02/2020 with EF 55 to 60%, grade 1 DD, normal RV, no significant valvular abnormalities.  Last seen by Lum Louis, NP on 12/01/2023 and upcoming surgery discussed.  Per note, According to the Revised Cardiac Risk Index (RCRI), her Perioperative Risk of Major Cardiac Event is (%): 0.4. Her Functional Capacity in METs is: 6.61 according to the Duke Activity Status Index (DASI). Therefore, based on ACC/AHA guidelines, patient would be at acceptable risk for the planned procedure without further cardiovascular testing.   Neuro/Psych negative neurological ROS  negative psych ROS   GI/Hepatic ,neg GERD  ,,(+)     (-) substance abuse    Endo/Other  neg diabetesHypothyroidism    Renal/GU negative Renal ROS     Musculoskeletal  (+) Arthritis ,    Abdominal   Peds  Hematology   Anesthesia Other Findings Past Medical History: No date: Arthritis 07/2016: Bell's palsy No date: Bladder stone No date: History of syncope     Comment:  S/P CARDIAC ABLATION OF ARRYTHMIA No  date: Hypertension No date: Hypothyroidism No date: Osteoporosis 04/11/2013: Personal history of colonic adenomas No date: S/P ablation operation for arrhythmia     Comment:  1998 BY DR FERNANDE--  NO ISSUES SINCE  Reproductive/Obstetrics                              Anesthesia Physical Anesthesia Plan  ASA: 2  Anesthesia Plan: General   Post-op Pain Management: Gabapentin  PO (pre-op)* and Tylenol  PO (pre-op)*   Induction: Intravenous  PONV Risk Score and Plan: 4 or greater and Ondansetron , Dexamethasone  and Treatment may vary due to age or medical condition  Airway Management Planned: Oral ETT  Additional Equipment: None  Intra-op Plan:   Post-operative Plan: Extubation in OR  Informed Consent: I have reviewed the patients History and Physical, chart, labs and discussed the procedure including the risks, benefits and alternatives for the proposed anesthesia with the patient or authorized representative who has indicated his/her understanding and acceptance.     Dental advisory given  Plan Discussed with: CRNA and Surgeon  Anesthesia Plan Comments: (Discussed risks of anesthesia with patient, including PONV, sore throat, lip/dental/eye damage, post operative cognitive dysfunction. Rare risks discussed as well, such as cardiorespiratory and neurological sequelae, and allergic reactions. Discussed the role of CRNA in patient's perioperative care. Patient understands.)         Anesthesia Quick Evaluation

## 2024-01-17 ENCOUNTER — Ambulatory Visit (HOSPITAL_COMMUNITY): Payer: Self-pay | Admitting: Physician Assistant

## 2024-01-17 ENCOUNTER — Ambulatory Visit (HOSPITAL_COMMUNITY)
Admission: RE | Admit: 2024-01-17 | Discharge: 2024-01-17 | Disposition: A | Discharge status: Home / Self Care | Attending: General Surgery | Admitting: General Surgery

## 2024-01-17 ENCOUNTER — Ambulatory Visit (HOSPITAL_BASED_OUTPATIENT_CLINIC_OR_DEPARTMENT_OTHER)

## 2024-01-17 ENCOUNTER — Other Ambulatory Visit: Payer: Self-pay

## 2024-01-17 ENCOUNTER — Encounter (HOSPITAL_COMMUNITY): Payer: Self-pay | Admitting: General Surgery

## 2024-01-17 ENCOUNTER — Encounter (HOSPITAL_COMMUNITY)
Admission: RE | Disposition: A | Discharge status: Home / Self Care | Payer: Self-pay | Source: Home / Self Care | Attending: General Surgery

## 2024-01-17 DIAGNOSIS — E785 Hyperlipidemia, unspecified: Secondary | ICD-10-CM | POA: Insufficient documentation

## 2024-01-17 DIAGNOSIS — M1990 Unspecified osteoarthritis, unspecified site: Secondary | ICD-10-CM | POA: Insufficient documentation

## 2024-01-17 DIAGNOSIS — E039 Hypothyroidism, unspecified: Secondary | ICD-10-CM | POA: Diagnosis not present

## 2024-01-17 DIAGNOSIS — I10 Essential (primary) hypertension: Secondary | ICD-10-CM | POA: Insufficient documentation

## 2024-01-17 DIAGNOSIS — K811 Chronic cholecystitis: Secondary | ICD-10-CM | POA: Diagnosis not present

## 2024-01-17 DIAGNOSIS — Z8249 Family history of ischemic heart disease and other diseases of the circulatory system: Secondary | ICD-10-CM | POA: Insufficient documentation

## 2024-01-17 DIAGNOSIS — K828 Other specified diseases of gallbladder: Secondary | ICD-10-CM | POA: Diagnosis present

## 2024-01-17 DIAGNOSIS — I2510 Atherosclerotic heart disease of native coronary artery without angina pectoris: Secondary | ICD-10-CM | POA: Insufficient documentation

## 2024-01-17 HISTORY — PX: CHOLECYSTECTOMY: SHX55

## 2024-01-17 SURGERY — LAPAROSCOPIC CHOLECYSTECTOMY
Anesthesia: General | Site: Abdomen

## 2024-01-17 MED ORDER — GABAPENTIN 300 MG PO CAPS
300.0000 mg | ORAL_CAPSULE | ORAL | Status: AC
  Administered 2024-01-17: 300 mg via ORAL
  Filled 2024-01-17: qty 1

## 2024-01-17 MED ORDER — INDOCYANINE GREEN 25 MG IV SOLR
2.5000 mg | Freq: Once | INTRAVENOUS | Status: AC
  Administered 2024-01-17: 2.5 mg via INTRAVENOUS

## 2024-01-17 MED ORDER — ONDANSETRON HCL 4 MG/2ML IJ SOLN
INTRAMUSCULAR | Status: AC
  Filled 2024-01-17: qty 2

## 2024-01-17 MED ORDER — DEXAMETHASONE SODIUM PHOSPHATE 10 MG/ML IJ SOLN
INTRAMUSCULAR | Status: DC | PRN
  Administered 2024-01-17: 10 mg via INTRAVENOUS

## 2024-01-17 MED ORDER — 0.9 % SODIUM CHLORIDE (POUR BTL) OPTIME
TOPICAL | Status: DC | PRN
  Administered 2024-01-17: 1000 mL

## 2024-01-17 MED ORDER — CHLORHEXIDINE GLUCONATE CLOTH 2 % EX PADS: 6.0000 | MEDICATED_PAD | Freq: Once | CUTANEOUS | Status: DC

## 2024-01-17 MED ORDER — OXYCODONE HCL 5 MG PO TABS: 5.0000 mg | ORAL_TABLET | Freq: Three times a day (TID) | ORAL | 0 refills | Status: AC | PRN

## 2024-01-17 MED ORDER — PHENYLEPHRINE 80 MCG/ML (10ML) SYRINGE FOR IV PUSH (FOR BLOOD PRESSURE SUPPORT)
PREFILLED_SYRINGE | INTRAVENOUS | Status: DC | PRN
  Administered 2024-01-17: 80 ug via INTRAVENOUS
  Administered 2024-01-17: 160 ug via INTRAVENOUS

## 2024-01-17 MED ORDER — ACETAMINOPHEN 325 MG PO TABS: 650.0000 mg | ORAL_TABLET | Freq: Four times a day (QID) | ORAL | 0 refills | Status: AC

## 2024-01-17 MED ORDER — PROPOFOL 10 MG/ML IV BOLUS
INTRAVENOUS | Status: DC | PRN
  Administered 2024-01-17: 80 mg via INTRAVENOUS

## 2024-01-17 MED ORDER — ONDANSETRON HCL 4 MG PO TABS
4.0000 mg | ORAL_TABLET | Freq: Once | ORAL | Status: AC
  Administered 2024-01-17: 4 mg via ORAL

## 2024-01-17 MED ORDER — OXYCODONE HCL 5 MG PO TABS
ORAL_TABLET | ORAL | Status: AC
  Filled 2024-01-17: qty 1

## 2024-01-17 MED ORDER — LABETALOL HCL 5 MG/ML IV SOLN
INTRAVENOUS | Status: AC
  Filled 2024-01-17: qty 4

## 2024-01-17 MED ORDER — FENTANYL CITRATE (PF) 250 MCG/5ML IJ SOLN
INTRAMUSCULAR | Status: AC
Start: 2024-01-17 — End: 2024-01-17
  Filled 2024-01-17: qty 5

## 2024-01-17 MED ORDER — LACTATED RINGERS IV SOLN: INTRAVENOUS | Status: DC

## 2024-01-17 MED ORDER — EPHEDRINE SULFATE-NACL 50-0.9 MG/10ML-% IV SOSY
PREFILLED_SYRINGE | INTRAVENOUS | Status: DC | PRN
  Administered 2024-01-17: 5 mg via INTRAVENOUS
  Administered 2024-01-17 (×2): 10 mg via INTRAVENOUS

## 2024-01-17 MED ORDER — IBUPROFEN 200 MG PO TABS
600.0000 mg | ORAL_TABLET | Freq: Four times a day (QID) | ORAL | 0 refills | Status: AC
Start: 2024-01-17 — End: 2024-01-23

## 2024-01-17 MED ORDER — FENTANYL CITRATE (PF) 100 MCG/2ML IJ SOLN
INTRAMUSCULAR | Status: AC
  Filled 2024-01-17: qty 2

## 2024-01-17 MED ORDER — OXYCODONE HCL 5 MG/5ML PO SOLN: 5.0000 mg | Freq: Once | ORAL | Status: AC | PRN

## 2024-01-17 MED ORDER — BUPIVACAINE-EPINEPHRINE (PF) 0.25% -1:200000 IJ SOLN
INTRAMUSCULAR | Status: DC | PRN
  Administered 2024-01-17: 30 mL

## 2024-01-17 MED ORDER — FENTANYL CITRATE (PF) 250 MCG/5ML IJ SOLN
INTRAMUSCULAR | Status: DC | PRN
  Administered 2024-01-17 (×5): 50 ug via INTRAVENOUS

## 2024-01-17 MED ORDER — ONDANSETRON HCL 4 MG/2ML IJ SOLN
INTRAMUSCULAR | Status: DC | PRN
  Administered 2024-01-17: 4 mg via INTRAVENOUS

## 2024-01-17 MED ORDER — LIDOCAINE 2% (20 MG/ML) 5 ML SYRINGE
INTRAMUSCULAR | Status: AC
  Filled 2024-01-17: qty 5

## 2024-01-17 MED ORDER — FENTANYL CITRATE (PF) 100 MCG/2ML IJ SOLN
25.0000 ug | INTRAMUSCULAR | Status: DC | PRN
  Administered 2024-01-17 (×4): 25 ug via INTRAVENOUS

## 2024-01-17 MED ORDER — ONDANSETRON HCL 4 MG PO TABS
ORAL_TABLET | ORAL | Status: AC
  Filled 2024-01-17: qty 1

## 2024-01-17 MED ORDER — DROPERIDOL 2.5 MG/ML IJ SOLN
INTRAMUSCULAR | Status: AC
  Filled 2024-01-17: qty 2

## 2024-01-17 MED ORDER — CEFAZOLIN SODIUM-DEXTROSE 2-4 GM/100ML-% IV SOLN
2.0000 g | Freq: Once | INTRAVENOUS | Status: AC
  Administered 2024-01-17: 2 g via INTRAVENOUS
  Filled 2024-01-17: qty 100

## 2024-01-17 MED ORDER — BUPIVACAINE-EPINEPHRINE (PF) 0.25% -1:200000 IJ SOLN
INTRAMUSCULAR | Status: AC
  Filled 2024-01-17: qty 30

## 2024-01-17 MED ORDER — CHLORHEXIDINE GLUCONATE 0.12 % MT SOLN
15.0000 mL | Freq: Once | OROMUCOSAL | Status: AC
  Administered 2024-01-17: 15 mL via OROMUCOSAL
  Filled 2024-01-17: qty 15

## 2024-01-17 MED ORDER — ROCURONIUM BROMIDE 10 MG/ML (PF) SYRINGE
PREFILLED_SYRINGE | INTRAVENOUS | Status: AC
  Filled 2024-01-17: qty 10

## 2024-01-17 MED ORDER — SUGAMMADEX SODIUM 200 MG/2ML IV SOLN
INTRAVENOUS | Status: AC
  Filled 2024-01-17: qty 2

## 2024-01-17 MED ORDER — SUGAMMADEX SODIUM 200 MG/2ML IV SOLN
INTRAVENOUS | Status: DC | PRN
  Administered 2024-01-17: 200 mg via INTRAVENOUS

## 2024-01-17 MED ORDER — ONDANSETRON HCL 4 MG/2ML IJ SOLN: 4.0000 mg | Freq: Once | INTRAMUSCULAR | Status: DC | PRN

## 2024-01-17 MED ORDER — PHENYLEPHRINE HCL-NACL 20-0.9 MG/250ML-% IV SOLN
INTRAVENOUS | Status: DC | PRN
  Administered 2024-01-17: 40 ug/min via INTRAVENOUS

## 2024-01-17 MED ORDER — DEXAMETHASONE SODIUM PHOSPHATE 10 MG/ML IJ SOLN
INTRAMUSCULAR | Status: AC
  Filled 2024-01-17: qty 1

## 2024-01-17 MED ORDER — LABETALOL HCL 5 MG/ML IV SOLN
10.0000 mg | INTRAVENOUS | Status: DC | PRN
  Administered 2024-01-17: 10 mg via INTRAVENOUS

## 2024-01-17 MED ORDER — LIDOCAINE 2% (20 MG/ML) 5 ML SYRINGE
INTRAMUSCULAR | Status: DC | PRN
  Administered 2024-01-17: 80 mg via INTRAVENOUS

## 2024-01-17 MED ORDER — SODIUM CHLORIDE 0.9 % IR SOLN
Status: DC | PRN
  Administered 2024-01-17: 1

## 2024-01-17 MED ORDER — PROPOFOL 10 MG/ML IV BOLUS
INTRAVENOUS | Status: AC
  Filled 2024-01-17: qty 20

## 2024-01-17 MED ORDER — ROCURONIUM BROMIDE 10 MG/ML (PF) SYRINGE
PREFILLED_SYRINGE | INTRAVENOUS | Status: DC | PRN
  Administered 2024-01-17: 40 mg via INTRAVENOUS
  Administered 2024-01-17: 20 mg via INTRAVENOUS

## 2024-01-17 MED ORDER — ORAL CARE MOUTH RINSE: 15.0000 mL | Freq: Once | OROMUCOSAL | Status: AC

## 2024-01-17 MED ORDER — ACETAMINOPHEN 500 MG PO TABS
1000.0000 mg | ORAL_TABLET | ORAL | Status: AC
  Administered 2024-01-17: 1000 mg via ORAL
  Filled 2024-01-17: qty 2

## 2024-01-17 MED ORDER — OXYCODONE HCL 5 MG PO TABS
5.0000 mg | ORAL_TABLET | Freq: Once | ORAL | Status: AC | PRN
  Administered 2024-01-17: 5 mg via ORAL

## 2024-01-17 MED ORDER — DROPERIDOL 2.5 MG/ML IJ SOLN: 0.6250 mg | Freq: Once | INTRAMUSCULAR | Status: DC

## 2024-01-17 SURGICAL SUPPLY — 34 items
CANISTER SUCTION 3000ML PPV (SUCTIONS) ×1 IMPLANT
CHLORAPREP W/TINT 26 (MISCELLANEOUS) ×1 IMPLANT
CLIP APPLIE ROT 10 11.4 M/L (STAPLE) ×1 IMPLANT
COVER SURGICAL LIGHT HANDLE (MISCELLANEOUS) ×1 IMPLANT
DERMABOND ADVANCED .7 DNX12 (GAUZE/BANDAGES/DRESSINGS) ×1 IMPLANT
ELECTRODE REM PT RTRN 9FT ADLT (ELECTROSURGICAL) ×1 IMPLANT
ENDOLOOP SUT PDS II 0 18 (SUTURE) IMPLANT
GLOVE BIO SURGEON STRL SZ7 (GLOVE) ×1 IMPLANT
GOWN STRL REUS W/ TWL LRG LVL3 (GOWN DISPOSABLE) ×2 IMPLANT
GOWN STRL REUS W/ TWL XL LVL3 (GOWN DISPOSABLE) ×1 IMPLANT
GRASPER SUT TROCAR 14GX15 (MISCELLANEOUS) ×1 IMPLANT
IRRIGATION SUCT STRKRFLW 2 WTP (MISCELLANEOUS) ×1 IMPLANT
KIT BASIN OR (CUSTOM PROCEDURE TRAY) ×1 IMPLANT
KIT IMAGING PINPOINTPAQ (MISCELLANEOUS) IMPLANT
KIT TURNOVER KIT B (KITS) ×1 IMPLANT
LHOOK LAP DISP 36CM (ELECTROSURGICAL) ×1 IMPLANT
NDL 22X1.5 STRL (OR ONLY) (MISCELLANEOUS) ×1 IMPLANT
NDL INSUFFLATION 14GA 120MM (NEEDLE) ×1 IMPLANT
NEEDLE 22X1.5 STRL (OR ONLY) (MISCELLANEOUS) ×1 IMPLANT
NEEDLE INSUFFLATION 14GA 120MM (NEEDLE) ×1 IMPLANT
NS IRRIG 1000ML POUR BTL (IV SOLUTION) ×1 IMPLANT
PAD ARMBOARD POSITIONER FOAM (MISCELLANEOUS) ×1 IMPLANT
PENCIL BUTTON HOLSTER BLD 10FT (ELECTRODE) ×1 IMPLANT
POUCH RETRIEVAL ECOSAC 10 (ENDOMECHANICALS) ×1 IMPLANT
SCISSORS LAP 5X35 DISP (ENDOMECHANICALS) ×1 IMPLANT
SET TUBE SMOKE EVAC HIGH FLOW (TUBING) ×1 IMPLANT
SLEEVE Z-THREAD 5X100MM (TROCAR) ×2 IMPLANT
SUT MNCRL AB 4-0 PS2 18 (SUTURE) ×1 IMPLANT
TOWEL GREEN STERILE (TOWEL DISPOSABLE) ×1 IMPLANT
TRAY LAPAROSCOPIC MC (CUSTOM PROCEDURE TRAY) ×1 IMPLANT
TROCAR Z THREAD OPTICAL 12X100 (TROCAR) ×1 IMPLANT
TROCAR Z-THREAD OPTICAL 5X100M (TROCAR) ×1 IMPLANT
WARMER LAPAROSCOPE (MISCELLANEOUS) ×1 IMPLANT
WATER STERILE IRR 1000ML POUR (IV SOLUTION) ×1 IMPLANT

## 2024-01-17 NOTE — H&P (Signed)
 Natalie Schultz 12/11/1955  [id number].    HPI:  68 y/o F w/ gallbladder dysfunction who presents for elective cholecystectomy. She reports that she is in her usual state of health and denies any recent changes in medication.   ROS: Review of Systems  Constitutional: Negative.   HENT: Negative.    Eyes: Negative.   Respiratory: Negative.    Cardiovascular: Negative.   Gastrointestinal: Negative.   Genitourinary: Negative.   Musculoskeletal: Negative.   Skin: Negative.   Neurological: Negative.   Endo/Heme/Allergies: Negative.   Psychiatric/Behavioral: Negative.      Family History  Problem Relation Age of Onset   Other Mother        caricosities   Hypertension Father    Prostate cancer Father    Other Sister        caricosities   Colon cancer Neg Hx    Diabetes Neg Hx    Heart disease Neg Hx    Kidney disease Neg Hx    Liver disease Neg Hx    Breast cancer Neg Hx     Past Medical History:  Diagnosis Date   Arthritis    Bell's palsy 07/2016   Bladder stone    History of syncope    S/P CARDIAC ABLATION OF ARRYTHMIA   Hypertension    Hypothyroidism    Osteoporosis    Personal history of colonic adenomas 04/11/2013   S/P ablation operation for arrhythmia    1998 BY DR FERNANDE--  NO ISSUES SINCE    Past Surgical History:  Procedure Laterality Date   CARDIAC CATHETERIZATION  2002   with ablation   CARDIAC ELECTROPHYSIOLOGY STUDY AND ABLATION  1998 (APPROX)  DR FERNANDE   ARRHYTHMIA (PT UNSURE TYPE BUT HAD SYNCOPAL EPISODES)  PER PT NO S &S SINCE   COLONOSCOPY W/ POLYPECTOMY  04/07/2013   CYSTOSCOPY WITH BIOPSY N/A 02/21/2013   Procedure: CYSTOSCOPY WITH BIOPSY  Transurethral laser of bladder stone and foreign body;  Surgeon: Natalie DELENA Elizabeth, MD;  Location: [facility] ORS;  Service: Urology;  Laterality: N/A;  60 mins req for this case    HOLMIUM LASER APPLICATION N/A 02/21/2013   Procedure: HOLMIUM LASER APPLICATION;  Surgeon: Natalie DELENA Elizabeth, MD;   Location: [facility] ORS;  Service: Urology;  Laterality: N/A;   LAPAROSCOPIC ASSISTED VAGINAL HYSTERECTOMY  1999   W/ RIGHT SALPINGOOPHORECTOMY AND BLADDER TACK SUSPENSION   LEFT HEART CATH AND CORONARY ANGIOGRAPHY N/A 02/20/2020   Procedure: LEFT HEART CATH AND CORONARY ANGIOGRAPHY;  Surgeon: [place], Peter M, MD;  Location: [facility] INVASIVE CV LAB;  Service: Cardiovascular;  Laterality: N/A;    Social History:  reports that she has never smoked. She has never used smokeless tobacco. She reports current alcohol use. She reports that she does not use drugs.  Allergies:  Allergies  Allergen Reactions   Amoxicillin Hives and Shortness Of Breath   Biaxin [Clarithromycin] Hives and Shortness Of Breath    Shortness of Breath   Erythromycin Hives and Shortness Of Breath    Medications Prior to Admission  Medication Sig Dispense Refill   acetaminophen  (TYLENOL ) 650 MG CR tablet Take 1,300 mg by mouth at bedtime.     bisoprolol (ZEBETA) 5 MG tablet Take 5 mg by mouth daily.     Calcium Citrate 200 MG TABS Take 400 mg by mouth once a week.     Cholecalciferol (VITAMIN D-3) 125 MCG (5000 UT) TABS Take 5,000 Units by mouth every Monday, Wednesday, and Friday.  docusate sodium (COLACE) 100 MG capsule Take 200 mg by mouth at bedtime.     glucosamine-chondroitin 500-400 MG tablet Take 1 tablet by mouth daily.     levothyroxine (SYNTHROID) 200 MCG tablet Take 200 mcg by mouth daily before breakfast.     loratadine (CLARITIN) 10 MG tablet Take 10 mg by mouth daily.     losartan (COZAAR) 50 MG tablet Take 50 mg by mouth every evening.  12   meloxicam  (MOBIC ) 15 MG tablet Take 1 tablet (15 mg total) by mouth daily. 30 tablet 1   metroNIDAZOLE (METROCREAM) 0.75 % cream Apply 1 application  topically daily.     Multiple Vitamin (MULTI-VITAMIN DAILY PO) Take 1 tablet by mouth daily. Vita Pack     Multiple Vitamins-Minerals (PRESERVISION AREDS 2) CAPS Take 2 capsules by mouth daily.     omeprazole  (PRILOSEC ) 20 MG  capsule Take 20 mg by mouth daily.     polyethylene glycol (MIRALAX / GLYCOLAX) 17 g packet Take 17 g by mouth at bedtime.     rosuvastatin (CRESTOR) 10 MG tablet Take 10 mg by mouth daily.     tretinoin (RETIN-A) 0.1 % cream Apply 1 application  topically at bedtime.     Triamcinolone Acetonide (NASACORT ALLERGY 24HR NA) Place 2 sprays into the nose daily as needed (allergies).     zinc gluconate 50 MG tablet Take 50 mg by mouth daily.     zolpidem (AMBIEN) 10 MG tablet Take 10 mg by mouth at bedtime as needed for sleep.      Physical Exam: Blood pressure 128/83, pulse (!) 51, temperature 97.9 F (36.6 C), temperature source Oral, resp. rate 18, height 5' 7 (1.702 Schultz), weight 72.1 kg, last menstrual period 07/06/1997, SpO2 95%. Gen: female, NAD Abd: soft, non-distended, non-tender  No results found for this or any previous visit (from the past 48 hours). No results found.  Assessment/Plan 68 y/o F w/ gallbladder dysfunction  - Will proceed to the OR. We discussed the alternatives and potential risks of surgery, including but not limited to: bleeding, infection, damage to bowel or surrounding structures, bile leak, damage to the biliary system, retained stone, pancreatitis, and need for additional procedures. All questions were addressed and consent was obtained.    Natalie Schultz Surgery 01/17/2024, 6:52 AM Please see Amion for pager number during day hours 7:00am-4:30pm or 7:00am -11:30am on weekends

## 2024-01-17 NOTE — Anesthesia Procedure Notes (Signed)
 Procedure Name: Intubation Date/Time: 01/17/2024 7:40 AM  Performed by: Shlomo Tinnie SAILOR, RNPre-anesthesia Checklist: Patient identified, Emergency Drugs available, Suction available, Timeout performed and Patient being monitored Patient Re-evaluated:Patient Re-evaluated prior to induction Oxygen Delivery Method: Circle system utilized Preoxygenation: Pre-oxygenation with 100% oxygen Induction Type: IV induction Ventilation: Mask ventilation without difficulty Laryngoscope Size: Mac and 3 Grade View: Grade I Tube type: Oral Tube size: 6.5 mm Number of attempts: 1 Airway Equipment and Method: Stylet Placement Confirmation: ETT inserted through vocal cords under direct vision, positive ETCO2, CO2 detector and breath sounds checked- equal and bilateral Secured at: 22 cm Tube secured with: Tape Dental Injury: Teeth and Oropharynx as per pre-operative assessment

## 2024-01-17 NOTE — Anesthesia Postprocedure Evaluation (Signed)
 Anesthesia Post Note  Patient: Natalie Schultz  Procedure(s) Performed: LAPAROSCOPIC CHOLECYSTECTOMY (Abdomen)     Patient location during evaluation: PACU Anesthesia Type: General Level of consciousness: awake and alert Pain management: pain level controlled Vital Signs Assessment: post-procedure vital signs reviewed and stable Respiratory status: spontaneous breathing, nonlabored ventilation, respiratory function stable and patient connected to nasal cannula oxygen Cardiovascular status: blood pressure returned to baseline and stable Postop Assessment: no apparent nausea or vomiting Anesthetic complications: no   No notable events documented.  Last Vitals:  Vitals:   01/17/24 0945 01/17/24 1000  BP: (!) 181/86 (!) 183/88  Pulse: (!) 49 (!) 56  Resp: 13 14  Temp:  36.4 C  SpO2: 97% 94%    Last Pain:  Vitals:   01/17/24 0928  TempSrc:   PainSc: 7                  Rome Ade

## 2024-01-17 NOTE — Transfer of Care (Signed)
 Immediate Anesthesia Transfer of Care Note  Patient: Natalie Schultz  Procedure(s) Performed: LAPAROSCOPIC CHOLECYSTECTOMY (Abdomen)  Patient Location: PACU  Anesthesia Type:General  Level of Consciousness: drowsy and patient cooperative  Airway & Oxygen Therapy: Patient Spontanous Breathing and Patient connected to face mask oxygen  Post-op Assessment: Report given to RN, Post -op Vital signs reviewed and stable, and Patient moving all extremities  Post vital signs: Reviewed and stable  Last Vitals:  Vitals Value Taken Time  BP 181/90 01/17/24 09:00  Temp 36.4 C 01/17/24 08:57  Pulse 62 01/17/24 09:06  Resp 14 01/17/24 09:06  SpO2 98 % 01/17/24 09:06  Vitals shown include unfiled device data.  Last Pain:  Vitals:   01/17/24 0857  TempSrc:   PainSc: Asleep      Patients Stated Pain Goal: 2 (01/17/24 0617)  Complications: No notable events documented.

## 2024-01-17 NOTE — Op Note (Signed)
 Patient: Natalie Schultz MRN: [id number] DOB: 07-01-1956 Sex: female Operation/Procedure Date: 01/17/2024 Surgeons and Role:    * Polly, Natalie LABOR, MD - Primary Resident: Mae Saupe, MD  Pre-operative Diagnoses: BILIARY DYSKINESIA Postoperative Diagnoses: BILIARY DYSKINESIA  Procedure performed: Procedure:    LAPAROSCOPIC CHOLECYSTECTOMY CPT(R) Code:  52437 - PR LAPAROSCOPY SURG CHOLECYSTECTOMY  Anesthesia: General endotracheal anesthesia  Indications: Natalie Schultz is a 68 year old female who was evaluated in the office and determined to have gallbladder disease. She desired cholecystectomy. Preoperatively, I discussed in detail the risks, benefits, alternatives, and potential complications. The patient understands and requests to proceed.  Operative Findings: Critical view obtained prior to placing clips  Operative Narrative: The patient was positively identified and was taken to the operating room and placed supine on the operating table. A time-out was performed confirming correct patient and procedure. We also confirmed initiation of deep venous thrombosis prophylaxis and wound prophylaxis. After successful induction of general endotracheal anesthesia, the arms were carefully padded. An orogastric tube and footboard were placed. The abdomen was prepped and draped in the usual sterile surgical fashion.  We began our peritoneal access with a veress needle inserted at Palmer's point.  After aspiration showed return of air bubbles and there was a positive saline drop test, the insufflation was connected and the abdomen brought to a pressure of .  We then used an opti-view technique to place a 5mm port just to the right of midline, superior to the umbilicus.  A laparoscope was introduced into the abdomen, and there were no signs of injury from entry.  A 12mm port was placed in the subxiphoid position.  Two additional 5mm ports were placed in the RUQ.  A 360-degree  visualization with a 30-degree 5-mm laparoscope revealed grossly normal intra-abdominal contents.   The patient was placed in the head up position and tilted slightly to the left. The dome of the gallbladder was then grasped, elevated, and retracted anteriorly and cephalad.  The infundibulum was retracted laterally and inferiorly exposing Calot's triangle. The investing visceral peritoneal attachments overlying the infundibulum of the gallbladder were incised using the hook electrocautery and dissected free from the gallbladder itself. We soon developed two structures into the gallbladder consistent with the cystic duct and cystic artery. The loose areolar tissue around these structures was dissected free. The gallbladder was separated from the gallbladder fossa for approximately a third the distance up from the cystic plate, establishing the critical view of safety. We then transitioned to infrared viewing mode to allow for visualization of the ICG tracer. There was tracer throughout the liver. There was tracer within the candidate cystic duct as well as in a separate structure medially to the duct, consistent with where the common bile duct would be expected.  There was no tracer within the candidate cystic artery.  There was tracer within the duodenum, indicating no presence of a biliary obstruction. We returned to normal viewing mode. The cystic duct and cystic artery were triply clipped. These were divided using laparoscopic scissors leaving a single clip on the removal side. The gallbladder was elevated off the gallbladder fossa using the hook Bovie. The gallbladder was then exteriorized through the subxiphoid port site using an EndoCatch bag. We reestablished pneumoperitoneum and confirmed no leakage of blood or bile. We confirmed integrity of our clips. The subhepatic space was irrigated with warm sterile saline and suctioned free. The 12mm port site was closed using an 0-vicryl on a suture passer.We  placed 0.25% Marcaine   with epinephrine  at each incision site for local anesthesia. The skin was closed using 4-0 Monocryl subcuticular suture. Dermabond was applied. The patient tolerated the procedure well, was extubated, and taken to the recovery room.  Estimated Blood Loss: 20mL Specimens: Gallbladder Implants: None Drains: None Complications: None Condition of the patient: Good, extubated Disposition: PACU  Natalie Schultz Date: 01/17/2024 Time: 8:42 AM

## 2024-01-17 NOTE — Discharge Instructions (Signed)
 Home Care After Gallbladder Removal  Activity  Limit activity for the first 24 hours, then you may return to normal daily activities. Returning to normal daily activities as soon as you can following surgery will enhance recovery time.  No heavy lifting pushing or pulling, anything heavier than 10 pounds (gallon of milk weighs approx. 8.8 pounds) for 2 weeks from surgery date.  Do not mow the lawn, use a vacuum cleaner, or do any other strenuous activities without first consulting your surgical team.  Climb stairs slowly and watch your step.  Walk as often as you feel able to increase strength and endurance.  No driving or operating heavy machinery within 24 hours of taking narcotic pain medication.  Diet  Drink plenty of fluids and eat a light meal on the night of surgery. Some patients may find their appetite is poor for a week or two after surgery. This is a normal result of the stress of surgery-your appetite will return in time.   There are no specific diet restrictions after surgery and you resume regular diet as tolerated. However, you may want to refrain from fatty, heavy, and/or greasy foods and follow a low fat diet for 2-4 weeks to avoid temporary diarrhea and nausea. Slowly add fats back into diet.   If you have diarrhea, try avoiding spicy foods, dairy products, fatty foods, and alcohol . You can also watch to see if specific foods cause it, and stop eating them. If the diarrhea continues for more than 2 weeks, talk to your doctor.  Dressings and Wound Care  Keep your wound or incision site clean and dry.  You may have different types of dressings covering your incisions depending on your operation and your surgeon: Dermabond/Durabond (skin glue): This will usually remain in place for 10-14 days, then naturally fall off your skin. You may take a shower 24 hrs after surgery, carefully wash, not scrub the incision site with a mild non-scented soap. Pat dry with a soft towel.  Do not pick or  peel skin glue off.  You can shower and let the water fall on the dressings above. Do not soak or submerge your incision(s) in a bath tub, hot tub, or swimming pool, until your doctor says it is ok to do so or the incision(s) have completely healed, usually about 2-4 weeks.  Do not use creams, powder, salves or balms on your incision(s).  What to Expect After Surgery  Moderate discomfort controlled with medications  You may feel pain in one or both shoulders. This pain comes from the gas still left in your belly after the surgery, if you had laparoscopic surgery (several small incisions). The pain should ease over several days to a week. Ambulation will help with this pain.   Minimal drainage from incision  Belly swelling  Feeling fatigue and weak  Loose stools after eating. This may occur for 4-8 weeks; however longer in some cases.   Constipation after surgery is common. Drink plenty fluids and eat a high fiber diet.   Pain Control: Prescribed Non-Narcotic Pain Medication  You will be given three prescriptions.  Two of them will be for prescription strength ibuprofen (i.e. Advil) and prescription strength acetaminophen  (i.e. Tylenol ).  The vast majority of patients will just need these two medications.  One prescription will be for a 'rescue' prescription of an oral narcotic (oxycodone ).  You may fill this if needed.  You will alternate taking the ibuprofen (600mg ) every 6 hours and also the acetaminophen  (650mg )  every 6 hours so that you are taking one of those medications every 3 hours.  For example: o 0800 - take ibuprofen 600mg  o 1100 - take acetaminophen  650mg  o 1400 - take ibuprofen 600mg  o 1700 - take acetaminophen  650mg  o Etc.  Continue taking this alternating pattern of ibuprofen and acetaminophen  for 3 days  If you cannot take one or the other of these medications, just take the one you can every 6 hours.  If you are comfortable at night, you don't have to wake up and take a  medication.  If you are still uncomfortable after taking either ibuprofen or acetaminophen , try gentle stretching exercise and ice packs (a bag of frozen vegetables works great).  If you are still uncomfortable, you may fill the narcotic prescription of Oxycodone  and take as directed.  Once you have completed these prescriptions, your pain level should be low enough to stop taking medications altogether or just use an over the counter medication (ibuprofen or acetaminophen ) as needed.   Pain Control: Over the Counter Medications to take as needed  Colace/Docusate: May be prescribed by your surgeon to prevent constipation caused by the combination of narcotics, effects of anesthesia, and decreased ambulation.  Hold for loose stools or diarrhea. Take 100 mg 1-2 times a day starting tonight.   Fiber: High fiber foods, extra liquids (water 9-13 cups/day) can also assist with constipation. Examples of high fiber foods are fruit, bran. Prune juice and water are also good liquids to drink.  Milk of Magnesia/Miralax :  If constipated despite take the over the counter stool softeners, you may take Milk of Magnesia or Miralax  as directed on bottle to assist with constipation.     Pepcid/Famotidine: May be prescribed while taking naproxen (Aleve) or other NSAIDs such as ibuprofen (Motrin/Advil) to prevent stomach upset or Acid-reflux symptoms. Take 1 tablet 1-2 times a day.   **Constipation: The first bowel movement may occur anywhere between 1-5 days after surgery.  As long as you are not nauseated or not having significant abdominal pain this variation is acceptable. Narcotic pain medications can cause constipation increasing discomfort; early discontinuation will assist with bowel management.If constipated despite taking stool softeners,  you may take Milk of Magnesia or Miralax  as directed on the bottle.     **Home medications: You may restart your home medications as directed by your respective Primary Care  Physician or Surgeon.   When to notify your Doctor or Healthcare Team  Sign of Wound Infection   Fever over 100 degrees.  Wound becomes extremely swollen, shows red streaks, warm to the touch, and/or drainage from the incision site or foul-smelling drainage.  Wound edges separate or opens up  Bleeding or bruising   If you have bleeding, apply pressure to the site and hold the pressure firmly for 5 minutes. If the bleeding continues, apply pressure again and call 911. If the bleeding stopped, call your doctor to report it.   Call your doctor or nurse if you have increased bleeding from your site and increased bruising or a lump forms or gets larger under your skin at the site.  Unrelieved Pain    Call your doctor or nurse if your pain gets worse or is not eased 1 hour after taking your pain medicine, or if it is severe and uncontrolled.  Fever, Flu-like symptoms   Fever over 100 degrees and/or chills  Gastrointestinal Symptoms    If you have yellowing of your eyes or skin (jaundice)  If you have  dark or rust-colored urine  If your stool is gray colored   If you have nausea and vomiting that continues more than 24 hours, will not let you keep medicine down and will not let you keep fluids down  Black tarry bowel movements. This can be normal after surgery on the stomach, but should resolve in a day or two.    Call 911 if you suddenly have signs of blood loss such as:  Vomiting blood  Fast heart rate  Feeling faint, sweaty, or blacking out  Passing bright red blood from your rectum  Blood Clot Symptoms   Tender, swollen or reddened areas in your calf muscle or thighs.  Numbness or tingling in your lower leg or calf, or at the top of your leg or groin  Skin on your leg looks pale or blue or feels cold to touch  Chest pain or have trouble breathing, lightheadedness, fast heart rate  Sudden Onset of Symptoms    Call 911 if you suddenly have:  Leg weakness and spasm  Loss of bladder  or bowel function  Seizure  Confusion, severe headache, dizziness or feeling unsteady, problems talking, difficulty swallowing, and/or numbness or muscle weakness as these could be signs of a stroke.   Follow up Appointment Your follow up appointment should be scheduled 2-3 weeks after your surgery date.  If you have not previously scheduled for a follow-up visit you can be scheduled by contacting [phone]

## 2024-01-18 ENCOUNTER — Encounter (HOSPITAL_COMMUNITY): Payer: Self-pay | Admitting: General Surgery

## 2024-01-18 LAB — SURGICAL PATHOLOGY
